# Patient Record
Sex: Male | Born: 1985 | Race: White | Hispanic: No | Marital: Single | Smoking: Never smoker
Health system: Southern US, Community
[De-identification: ages and names within clinical notes are randomized; demographics above are authoritative.]

## PROBLEM LIST (undated history)

## (undated) DIAGNOSIS — K219 Gastro-esophageal reflux disease without esophagitis: Secondary | ICD-10-CM

## (undated) DIAGNOSIS — E7800 Pure hypercholesterolemia, unspecified: Secondary | ICD-10-CM

## (undated) DIAGNOSIS — I10 Essential (primary) hypertension: Secondary | ICD-10-CM

## (undated) DIAGNOSIS — Q8719 Other congenital malformation syndromes predominantly associated with short stature: Secondary | ICD-10-CM

## (undated) HISTORY — PX: NO PAST SURGERIES: SHX2092

## (undated) HISTORY — DX: Other congenital malformation syndromes predominantly associated with short stature: Q87.19

## (undated) HISTORY — DX: Pure hypercholesterolemia, unspecified: E78.00

## (undated) HISTORY — DX: Essential (primary) hypertension: I10

## (undated) HISTORY — DX: Gastro-esophageal reflux disease without esophagitis: K21.9

---

## 2009-08-09 ENCOUNTER — Emergency Department (HOSPITAL_COMMUNITY): Admission: EM | Admit: 2009-08-09 | Discharge: 2009-08-09 | Payer: Self-pay | Admitting: Emergency Medicine

## 2010-10-28 ENCOUNTER — Ambulatory Visit: Payer: Self-pay | Admitting: Internal Medicine

## 2010-10-28 ENCOUNTER — Encounter: Payer: Self-pay | Admitting: Internal Medicine

## 2010-10-28 DIAGNOSIS — L0390 Cellulitis, unspecified: Secondary | ICD-10-CM

## 2010-10-28 DIAGNOSIS — L0291 Cutaneous abscess, unspecified: Secondary | ICD-10-CM

## 2010-11-06 ENCOUNTER — Ambulatory Visit: Payer: Self-pay | Admitting: Internal Medicine

## 2010-12-18 NOTE — Assessment & Plan Note (Signed)
Summary: NEW TO MD AND CLINIC ?INFECTION/CH   Vital Signs:  Patient profile:   25 year old male Height:      72 inches Weight:      201.9 pounds BMI:     27.48 Temp:     97.9 degrees F oral Pulse rate:   71 / minute BP sitting:   115 / 73  (right arm)  Vitals Entered By: Filomena Jungling NT II (October 28, 2010 9:36 AM) CC: 3 years ago on the jaw bump, then 1 week ago this came on his forehead Is Patient Diabetic? No Pain Assessment Patient in pain? no      Nutritional Status BMI of 25 - 29 = overweight  Have you ever been in a relationship where you felt threatened, hurt or afraid?No   Does patient need assistance? Functional Status Self care Ambulation Normal   CC:  3 years ago on the jaw bump and then 1 week ago this came on his forehead.  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   He said he first noticed what started out as a pimple on his forehead about one week ago. Patient's sister thought it was best to "pop" it. She was not able to pop it at that time. Over the week, it has grown in size, started to swell. He also noticed that his right eyelid was swollen and puffy when he woke up this morning. He had a similar pimple/abscess on his right cheek a few years ago. That pimple was cultured and pt's mother was told it was MRSA. He received abx at that time.   Patient denies any fevers or chills. No other abscess or pimples anywhere else.   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   Head:  normocephalic and atraumatic.   Neck:  supple.   Lungs:  normal respiratory effort and normal breath sounds.   Heart:  normal rate and regular rhythm.   Abdomen:  soft and non-tender.   Skin:  3-4 cm abscess on forehead, there is no drainage from the area, there is some scabbing over the abscess, there is erythema surrounding the abscess and some puffiness located over the right eyelid it is not tender to touch, there is no warmth,  no other abscesses, pimples, lesions located anywhere else    Impression & Recommendations:  Problem # 1:  ABSCESS (ICD-682.9) Assessment New  Patient has hx of MRSA abscess on cheek. It is likely the abscess on the forehead is also MRSA. Will perform I&D of the abscess, send it off for culture, and start patient on doxycycline for 10 days.   Procedure - I&D of abscess on forehead Consent was obtained to perform I&D of facial abscess. Timeout was performed. Area was thoroughly cleaned with chloroseptic wipe. Lidocaine was then injected into affected area. Using an 11 blade and incision was made into the abscess. No puss was drained, but blood was expressed from site, and  sent for culture. After drainage of abscess, the area was cleaned with alcohol, and 2x2 gauze was applied. Patient was advised to keep dressing in place for one day. Along with I&D pt was given 10 day course of doxycycline covering S. Aureus. Patient was advised to let abscess drain, and was told to contact clinic if the abscess worsens or gets re-infected.   His updated medication list for this problem includes:    Doxycycline Hyclate 100 Mg Tabs (Doxycycline hyclate) .Marland Kitchen... Take 1 tablet by mouth two times a day for 10 days  Complete Medication List: 1)  Doxycycline Hyclate 100 Mg Tabs (Doxycycline hyclate) .... Take 1 tablet by mouth two times a day for 10 days  Patient Instructions: 1)  Please follow up with Dr. Baltazar Apo next week before Friday December 23rd, 2011. If you notice fever, chills, or any new abscesses, please come in sooner. 2)  Please take Doxycycline which is antibiotic twice daily for 10 days.  Prescriptions: DOXYCYCLINE HYCLATE 100 MG TABS (DOXYCYCLINE HYCLATE) Take 1 tablet by mouth two times a day for 10 days  #20 x 0   Entered and Authorized by:   Melida Quitter MD   Signed by:   Melida Quitter MD on 10/28/2010   Method used:   Print then Give to Patient   RxID:   [id number]    Orders Added: 1)  Est. Patient Level III [53664]      Appended Document: NEW TO MD AND CLINIC ?INFECTION/CH          Impression & Recommendations:  Problem # 1:  ABSCESS (ICD-682.9)  His updated medication list for this problem includes:    Doxycycline Hyclate 100 Mg Tabs (Doxycycline hyclate) .Marland Kitchen... Take 1 tablet by mouth two times a day for 10 days  Complete Medication List: 1)  Doxycycline Hyclate 100 Mg Tabs (Doxycycline hyclate) .... Take 1 tablet by mouth two times a day for 10 days  Other Orders: T-Culture, Wound ([id number])

## 2010-12-18 NOTE — Miscellaneous (Signed)
Summary: PATIENT CONSENT FORM  PATIENT CONSENT FORM   Imported By: Louretta Parma 11/13/2010 16:41:27  _____________________________________________________________________  External Attachment:    Type:   Image     Comment:   External Document

## 2010-12-18 NOTE — Miscellaneous (Signed)
Summary: CONSENT FOR MEDICAL /SURGICAL   CONSENT FOR MEDICAL /SURGICAL   Imported By: Margie Billet 11/05/2010 13:50:42  _____________________________________________________________________  External Attachment:    Type:   Image     Comment:   External Document

## 2010-12-18 NOTE — Assessment & Plan Note (Signed)
Summary: EST-1 WEEK RECHECK/CH   Vital Signs:  Patient profile:   25 year old male Height:      72 inches (182.88 cm) Weight:      210 pounds (95.45 kg) BMI:     28.58 Temp:     97.8 degrees F (36.56 degrees C) oral Pulse rate:   80 / minute BP sitting:   114 / 79  (right arm)  Vitals Entered By: Cynda Familia Duncan Dull) (November 06, 2010 9:21 AM) CC: one week reck, pt states he's much better Is Patient Diabetic? No Pain Assessment Patient in pain? no      Nutritional Status BMI of 25 - 29 = overweight  Have you ever been in a relationship where you felt threatened, hurt or afraid?No   Does patient need assistance? Functional Status Self care Ambulation Normal   CC:  one week reck and pt states he's much better.  History of Present Illness: Mr. 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   It was I&D last week, which did not grow anything, and was started on doxy for empiric tx of MRSA. Patient states he feels much better, and that the swelling on his forehead has decreased considerably. He is not experiencing any pain or tenderness, and denies any purulent discharge or pus from that area.   Patient denies any fevers or chills.   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  VS reviewed and BP wnl Head:  normocephalic and atraumatic.   Neck:  supple.   Lungs:  normal respiratory effort and normal breath sounds.   Heart:  normal rate and regular rhythm.   Abdomen:  soft and non-tender.   Msk:  normal ROM.   Neurologic:  nonfocal  Skin:  abscess on forehead much smaller, scab formation, appears to have healed pimple located above the prior abscess, no discharge or pus slight inflammation located on left cheek, no discharge, pus   Impression & Recommendations:  Problem # 1:  ABSCESS (ICD-682.9) Improved. Cx data negative. Pt has almost completed abx course. There are two other areas of inflammation/pimples located on forehead and left cheek. Advised patient to avoid touching these areas and apply warm compresses if there is any increased swelling.   His updated medication list for this problem includes:    Doxycycline Hyclate 100 Mg Tabs (Doxycycline hyclate) .Marland Kitchen... Take 1 tablet by mouth two times a day for 10 days  Complete Medication List: 1)  Doxycycline Hyclate 100 Mg Tabs (Doxycycline hyclate) .... Take 1 tablet by mouth two times a day for  10 days   Orders Added: 1)  Est. Patient Level III [16109]     Prevention & Chronic Care Immunizations   Influenza vaccine: Not documented    Tetanus booster: Not documented    Pneumococcal vaccine: Not documented  Other Screening   Smoking status: never  (11/06/2010)

## 2011-04-22 ENCOUNTER — Encounter: Payer: Self-pay | Admitting: Internal Medicine

## 2011-07-21 ENCOUNTER — Ambulatory Visit (INDEPENDENT_AMBULATORY_CARE_PROVIDER_SITE_OTHER): Payer: Medicaid Other | Admitting: Internal Medicine

## 2011-07-21 ENCOUNTER — Telehealth: Payer: Self-pay | Admitting: *Deleted

## 2011-07-21 ENCOUNTER — Encounter: Payer: Self-pay | Admitting: Internal Medicine

## 2011-07-21 VITALS — BP 125/81 | HR 63 | Temp 97.6°F | Ht 72.0 in | Wt 196.3 lb

## 2011-07-21 DIAGNOSIS — L03119 Cellulitis of unspecified part of limb: Secondary | ICD-10-CM

## 2011-07-21 DIAGNOSIS — L02429 Furuncle of limb, unspecified: Secondary | ICD-10-CM

## 2011-07-21 DIAGNOSIS — L02419 Cutaneous abscess of limb, unspecified: Secondary | ICD-10-CM

## 2011-07-21 MED ORDER — DOXYCYCLINE HYCLATE 100 MG PO CAPS: 100.0000 mg | ORAL_CAPSULE | Freq: Two times a day (BID) | ORAL | Status: AC

## 2011-07-21 NOTE — Patient Instructions (Addendum)
Please call the clinic to make an appointment if needed. Please take doxycycline 100 mg twice a day for 1 week. If the lesions get worse, have fever/chills, nausea, vomiting,  call the clinic to make an appointment. Take advil 400 mg three times a day to help the pain and inflammation.

## 2011-07-21 NOTE — Telephone Encounter (Signed)
States pt came home this weekend w/ red pustules on L leg, swollen, looks like mrsa he has had before, desires appt, denies fever and drainage. appt given for this pm

## 2011-07-21 NOTE — Assessment & Plan Note (Signed)
As per history of present illness and physical exam, patient had probable bug or mosquito bite and has superficial secondary skin infection-likely MRSA as he had a she MRSA abscess in December 2011. I will treat him with doxycycline 100 mg by mouth twice a day for one week. Explained him and his mother to avoid going in sun for long time during this week and also if the infection gets worse in terms of his fever, chills, nausea vomiting, not feeling well or develops abscess or increased pain, call the clinic and make an appointment. Also advised him to take Advil 400 mg 3 times a day for next week to help inflammation and pain. Otherwise if the lesions are getting better at the end of the week of treatment with doxycycline-noted to call the clinic.

## 2011-07-21 NOTE — Progress Notes (Signed)
  Subjective:    Patient ID: Richard Marks, male    DOB: 12-27-1985, 25 y.o.   MRN: [id number]  HPI Richard Marks is a pleasant 25 year man who comes the clinic with complaint of left lower leg pain and red skin lesions since last Saturday. He is otherwise healthy and has no other issues. He went with his dad to [place], [place] during the weekend and probably got some bug bite there- as per mom who accompanies him today. He does complain of increased pain while putting pressure on his left leg. Denies any fever, chills, nausea, vomiting, chest pain, abdominal pain.    Review of Systems    as per history of present illness, all systems reviewed and negative Objective:   Physical Exam General: NAD HEENT: PERRL, EOMI, no scleral icterus Cardiac: RRR, no rubs, murmurs or gallops Pulm: clear to auscultation bilaterally, moving normal volumes of air Abd: soft, nontender, nondistended, BS present Ext: Multiple red round lesions with scabs on left lower extremity just above the ankle. Mildly warm to touch, nontender, non-draining. 1 similar lesion on left leg above the ankle. Neuro: alert and oriented X3, cranial nerves II-XII grossly intact, strength and sensation to light touch equal in bilateral upper and lower extremities        Assessment & Plan:

## 2011-07-21 NOTE — Telephone Encounter (Signed)
Agree. Thanks Daleen Bo.

## 2012-02-10 ENCOUNTER — Encounter: Payer: Medicaid Other | Admitting: Internal Medicine

## 2012-03-09 ENCOUNTER — Telehealth: Payer: Self-pay

## 2012-03-09 NOTE — Telephone Encounter (Signed)
Pt mom calling states that this is her last shot to try and get help for her son, she is asking if Cleta Alberts can write a letter explaining pt learning disabilities and his syndromes, please contact her at [phone]

## 2012-03-10 NOTE — Telephone Encounter (Signed)
Tried to call patient but phone just kept ringing.

## 2012-03-10 NOTE — Telephone Encounter (Signed)
Call Richard Marks and tell her to make an appointment for Richard Marks to see me at 104. That will allow me to update were Richard Marks is regarding his needs. Then I can write a letter on his behalf to see if we can get him approved for disability.

## 2012-03-11 NOTE — Telephone Encounter (Signed)
Spoke with patients mother and let her know that she should make an appointment with Dr. Cleta Alberts and he would reevaluate Richard Marks at that time.  Mother stated that she would.

## 2012-04-19 ENCOUNTER — Encounter: Payer: Self-pay | Admitting: Emergency Medicine

## 2012-04-19 ENCOUNTER — Ambulatory Visit (INDEPENDENT_AMBULATORY_CARE_PROVIDER_SITE_OTHER): Payer: Self-pay | Admitting: Emergency Medicine

## 2012-04-19 VITALS — BP 112/74 | HR 69 | Temp 97.3°F | Resp 16 | Ht 71.0 in | Wt 195.0 lb

## 2012-04-19 DIAGNOSIS — Q898 Other specified congenital malformations: Secondary | ICD-10-CM

## 2012-04-19 DIAGNOSIS — F8189 Other developmental disorders of scholastic skills: Secondary | ICD-10-CM

## 2012-04-19 DIAGNOSIS — Q8719 Other congenital malformation syndromes predominantly associated with short stature: Secondary | ICD-10-CM

## 2012-04-19 DIAGNOSIS — F79 Unspecified intellectual disabilities: Secondary | ICD-10-CM

## 2012-04-19 DIAGNOSIS — F819 Developmental disorder of scholastic skills, unspecified: Secondary | ICD-10-CM

## 2012-04-19 NOTE — Progress Notes (Signed)
  Subjective:    Patient ID: Richard Marks, male    DOB: 12-08-1985, 26 y.o.   MRN: [id number]  HPI patient enters today to discuss his situation regarding applications for Social Security disability. The patient has Robinows syndrome. The patient required special assistance during his school years. His IQ has been documented as low as well as the patient having significant learning disabilities that required help during school. He's had some physical disabilities with use of the upper extremities. He demonstrated that with movement of one arm also moves unintentionally. 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 The mother feels that since he worked a short period of time under the guidance of his father that this has been used against him regarding his certification for Social Security disability. Short period of time his father had enough income to where he did not qualify for Medicaid but has qualified at least since 2009. He does receive $200 a month for food stamps.they do have the help of a lawyer Mr. Shelle Iron.they have been turned down twice by [employer] and have an appeals pending     Review of Systems     Objective:   Physical ExamPhysical exam reveals microcephaly. 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 His chest is clear cardiac unremarkable neurological is intact without focal motor weakness.         Assessment & Plan:   Richard Marks inability to work is more based on his difficulty with learning more so than his physical disabilities. I  Do not think at the present time he is able to hold down a stable job. I do think he should get another opinion regarding his ability to obtain Social Security disability. I will contact his lawyer to get their  recommendations and opinion is regarding his appeal

## 2013-07-20 ENCOUNTER — Encounter: Payer: Self-pay | Admitting: Internal Medicine

## 2013-07-20 ENCOUNTER — Ambulatory Visit (INDEPENDENT_AMBULATORY_CARE_PROVIDER_SITE_OTHER): Payer: Medicaid Other | Admitting: Internal Medicine

## 2013-07-20 VITALS — BP 117/78 | HR 75 | Temp 97.9°F | Ht 71.0 in | Wt 189.5 lb

## 2013-07-20 DIAGNOSIS — Z23 Encounter for immunization: Secondary | ICD-10-CM

## 2013-07-20 DIAGNOSIS — Q8719 Other congenital malformation syndromes predominantly associated with short stature: Secondary | ICD-10-CM | POA: Insufficient documentation

## 2013-07-20 DIAGNOSIS — Q898 Other specified congenital malformations: Secondary | ICD-10-CM

## 2013-07-20 NOTE — Assessment & Plan Note (Addendum)
Patient doing very well, has appeal for disability with SS pending.  Patient has low IQ but physically few limitations. Only active compliant is occasional left knee pain for which he takes ibuprofen occasionally.  Will give Flu shot today. No indication for screening for lipids at this time.  Will screen at age 27. Will check Random blood glucose as mother has history of DM.-> was less than 100.  Patient does not need to be seen yearly, can follow up as needed and be seen again in 3-5 years.  Patient instructed he can come in for Flu clinic appointment's yearly without seeing provider.

## 2013-07-20 NOTE — Patient Instructions (Signed)
1.  Please make an appointment to come in and see [place] as you need. 2.  Call and make an appointment in 3 years for regular visit.

## 2013-07-20 NOTE — Progress Notes (Signed)
Patient ID: Richard Marks, male   DOB: [birth year], 27 y.o.   MRN: [id number]   Subjective:   Patient ID: Richard Marks male   DOB: [birth year] 27 y.o.   MRN: [id number]  HPI: [name] is a 27 y.o. male who presents to reestablish at clinic.  He has a past medical history [name] for which she was diagnosed at 27 years old.  He is also been diagnosed with a low IQ. He is currently appealing [employer] for disability. 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   he has no other past medical history. Takes no prescription or regular over-the-counter medications.   he does report that he saw an eye doctor who recommended glasses for reading. He does not smoke.    Past Medical History  Diagnosis Date  . [name]     Diagnosed at [facility] when patient was 27 years old   No current outpatient prescriptions on file.   No current facility-administered medications for this visit.   Family History  Problem Relation Age of Onset  . Diabetes Mother   . Hypertension Mother   . Hyperlipidemia Mother   . Hypertension Father   . Diabetes Sister   . Hypertension Sister   . Obesity Sister    History   Social History  . Marital Status: Single    Spouse Name: N/A    Number of Children: N/A  . Years of Education: N/A   Social History Main Topics  . Smoking status: Never Smoker   . Smokeless tobacco: None  . Alcohol Use: None  . Drug Use: None  . Sexual Activity: None   Other Topics Concern  . 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  HENT: Negative for ear pain, nosebleeds, neck pain and tinnitus.   Eyes: Negative for blurred vision and  double vision.  Respiratory: Negative for cough, hemoptysis, sputum production, shortness of breath and wheezing.   Cardiovascular: Negative for chest pain, palpitations and leg swelling.  Gastrointestinal: Negative for heartburn, nausea, vomiting, abdominal pain, diarrhea, constipation and melena.  Genitourinary: Negative for dysuria, urgency and frequency.  Musculoskeletal: Positive for joint pain (occasional left knee pain, chronic). Negative for myalgias.  Skin: Negative for rash.  Neurological: Negative for dizziness, speech change, focal weakness, weakness and headaches.  Psychiatric/Behavioral: Negative for depression.    Objective:  Physical Exam: Filed Vitals:   07/20/13 1322  BP: 117/78  Pulse: 75  Temp: 97.9 F (36.6 C)  TempSrc: Oral  Height: 5\' 11"  (1.803 m)  Weight: 189 lb 8 oz (85.957 kg)  SpO2: 99%   Physical Exam  Nursing note and vitals reviewed. Constitutional: He is oriented to person, place, and time. No distress.  HENT:  Head: Normocephalic and atraumatic.  Eyes: Conjunctivae and EOM are normal. Pupils are equal, round, and reactive to light.  Cardiovascular: Normal rate, regular rhythm, normal heart sounds and intact distal pulses.  Exam reveals no friction rub.   No murmur heard. Pulmonary/Chest: Effort normal and breath sounds normal. No respiratory distress. He has no wheezes. He has no rales.  Abdominal: Soft. Bowel sounds are  normal. There is no tenderness.  Musculoskeletal: Normal range of motion. He exhibits no edema.  Neurological: He is alert and oriented to person, place, and time. Gait normal.  Skin: He is not diaphoretic.  Psychiatric: Affect and judgment normal.    Assessment & Plan:   See Problem based Assessment and Plan.

## 2013-07-26 NOTE — Progress Notes (Signed)
I saw and evaluated the patient.  I personally confirmed the key portions of the history and exam documented by Dr. Hoffman and I reviewed pertinent patient test results.  The assessment, diagnosis, and plan were formulated together and I agree with the documentation in the resident's note. 

## 2016-05-11 ENCOUNTER — Other Ambulatory Visit: Payer: Self-pay

## 2016-05-11 ENCOUNTER — Emergency Department (HOSPITAL_COMMUNITY): Payer: Self-pay

## 2016-05-11 ENCOUNTER — Emergency Department (HOSPITAL_COMMUNITY)
Admission: EM | Admit: 2016-05-11 | Discharge: 2016-05-11 | Disposition: A | Discharge status: Home / Self Care | Payer: Self-pay | Attending: Emergency Medicine | Admitting: Emergency Medicine

## 2016-05-11 ENCOUNTER — Encounter (HOSPITAL_COMMUNITY): Payer: Self-pay | Admitting: Emergency Medicine

## 2016-05-11 DIAGNOSIS — K5900 Constipation, unspecified: Secondary | ICD-10-CM | POA: Insufficient documentation

## 2016-05-11 DIAGNOSIS — R109 Unspecified abdominal pain: Secondary | ICD-10-CM

## 2016-05-11 LAB — CBC
HCT: 41.7 % (ref 39.0–52.0)
Hemoglobin: 14.5 g/dL (ref 13.0–17.0)
MCH: 33.1 pg (ref 26.0–34.0)
MCHC: 34.8 g/dL (ref 30.0–36.0)
MCV: 95.2 fL (ref 78.0–100.0)
PLATELETS: 207 10*3/uL (ref 150–400)
RBC: 4.38 MIL/uL (ref 4.22–5.81)
RDW: 12.8 % (ref 11.5–15.5)
WBC: 5.8 10*3/uL (ref 4.0–10.5)

## 2016-05-11 LAB — URINALYSIS, ROUTINE W REFLEX MICROSCOPIC
BILIRUBIN URINE: NEGATIVE
GLUCOSE, UA: NEGATIVE mg/dL
HGB URINE DIPSTICK: NEGATIVE
KETONES UR: NEGATIVE mg/dL
Leukocytes, UA: NEGATIVE
Nitrite: NEGATIVE
PH: 7 (ref 5.0–8.0)
Protein, ur: NEGATIVE mg/dL
Specific Gravity, Urine: 1.013 (ref 1.005–1.030)

## 2016-05-11 LAB — COMPREHENSIVE METABOLIC PANEL
ALK PHOS: 49 U/L (ref 38–126)
ALT: 18 U/L (ref 17–63)
ANION GAP: 7 (ref 5–15)
AST: 19 U/L (ref 15–41)
Albumin: 4.3 g/dL (ref 3.5–5.0)
BUN: 19 mg/dL (ref 6–20)
CALCIUM: 8.9 mg/dL (ref 8.9–10.3)
CHLORIDE: 107 mmol/L (ref 101–111)
CO2: 26 mmol/L (ref 22–32)
Creatinine, Ser: 0.87 mg/dL (ref 0.61–1.24)
GFR calc non Af Amer: >60 mL/min (ref >60)
Glucose, Bld: 105 mg/dL — ABNORMAL HIGH (ref 65–99)
POTASSIUM: 3.9 mmol/L (ref 3.5–5.1)
SODIUM: 140 mmol/L (ref 135–145)
Total Bilirubin: 0.5 mg/dL (ref 0.3–1.2)
Total Protein: 7.4 g/dL (ref 6.5–8.1)

## 2016-05-11 MED ORDER — KETOROLAC TROMETHAMINE 30 MG/ML IJ SOLN
30.0000 mg | Freq: Once | INTRAMUSCULAR | Status: AC
Start: 2016-05-11 — End: 2016-05-11
  Administered 2016-05-11: 30 mg via INTRAVENOUS
  Filled 2016-05-11: qty 1

## 2016-05-11 MED ORDER — ONDANSETRON HCL 4 MG/2ML IJ SOLN
4.0000 mg | Freq: Once | INTRAMUSCULAR | Status: AC | PRN
  Administered 2016-05-11: 4 mg via INTRAVENOUS
  Filled 2016-05-11: qty 2

## 2016-05-11 NOTE — ED Notes (Signed)
Patient started having this pain Wednesday. Patient complaining the abdominal pain was worse tonight. Patient has learning disabilities and mom is power of attorney. Patients abdomen hurts at the lower half of the abdomen.

## 2016-05-11 NOTE — ED Provider Notes (Signed)
CSN: [id number]     Arrival date & time 05/11/16  0407 History   First MD Initiated Contact with Patient 05/11/16 [phone]     Chief Complaint  Patient presents with  . Abdominal Pain     (Consider location/radiation/quality/duration/timing/severity/associated sxs/prior Treatment) Patient is a 30 y.o. male presenting with abdominal pain. The history is provided by the patient. No language interpreter was used.  Abdominal Pain Pain location:  Generalized Pain quality: burning   Pain radiates to:  Does not radiate Pain severity:  Severe Onset quality:  Gradual Duration:  12 days Progression:  Worsening Chronicity:  New Context: not alcohol use and not retching   Relieved by:  Nothing Worsened by:  Nothing tried Ineffective treatments:  None tried Associated symptoms: no anorexia, no diarrhea and no vomiting   Risk factors: no alcohol abuse     Past Medical History  Diagnosis Date  . 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 No pertinent past surgical history. Family History  Problem Relation Age of Onset  . Diabetes Mother   . Hypertension Mother   . Hyperlipidemia Mother   . Hypertension Father   . Diabetes Sister   . Hypertension Sister   . Obesity Sister    Social History  Substance Use Topics  . Smoking status: Never Smoker   . Smokeless tobacco: Never Used  . Alcohol Use: No    Review of Systems  Gastrointestinal: Positive for abdominal pain. Negative for vomiting, diarrhea and anorexia.  All other systems reviewed and are negative.     Allergies  Review of patient's allergies indicates no known allergies.  Home Medications   Prior to Admission medications   Not on File   BP 137/87 mmHg  Pulse 71  Temp(Src) 99 F (37.2 C) (Oral)  Resp 16  Ht  (1.803 m)  Wt 200 lb (90.719 kg)  BMI 27.91 kg/m2  SpO2 99% Physical Exam  Constitutional: He is oriented to person, place, and time. He  appears well-developed and well-nourished. No distress.  HENT:  Head: Normocephalic and atraumatic.  Mouth/Throat: Oropharynx is clear and moist.  Eyes: Conjunctivae are normal. Pupils are equal, round, and reactive to light.  Neck: Normal range of motion. Neck supple.  Cardiovascular: Normal rate, regular rhythm and intact distal pulses.   Pulmonary/Chest: Effort normal and breath sounds normal. No respiratory distress. He has no wheezes. He has no rales.  Abdominal: Soft. Bowel sounds are normal. There is no tenderness. There is no rebound and no guarding.  Palpable stool in the descending colon  Musculoskeletal: Normal range of motion.  Neurological: He is alert and oriented to person, place, and time.  Skin: Skin is warm and dry.  Psychiatric: He has a normal mood and affect.    ED Course  Procedures (including critical care time) Labs Review Labs Reviewed  COMPREHENSIVE METABOLIC PANEL - Abnormal; Notable for the following:    Glucose, Bld 105 (*)    All other components within normal limits  URINALYSIS, ROUTINE W REFLEX MICROSCOPIC (NOT AT [facility])  CBC    Imaging Review Dg Abd Acute W/chest  05/11/2016  CLINICAL DATA:  Abdominal pain with nausea and vomiting EXAM: DG ABDOMEN ACUTE W/ 1V CHEST COMPARISON:  None. FINDINGS: PA chest: Lungs are clear. Heart size and pulmonary vascularity are normal. No adenopathy. Supine and upright abdomen: There is diffuse stool throughout the colon. There is no bowel dilatation  or air-fluid level suggesting obstruction. No free air. No abnormal calcifications are identified. IMPRESSION: Stool throughout colon. No bowel obstruction or free air evident. No lung edema or consolidation. Electronically Signed   By: Bretta BangWilliam  Woodruff III M.D.   On: 05/11/2016 07:14   I have personally reviewed and evaluated these images and lab results as part of my medical decision-making.   EKG Interpretation None      MDM   Final diagnoses:  None    Filed  Vitals:   05/11/16 0700 05/11/16 0742  BP: 137/87 129/90  Pulse: 71 75  Temp:    Resp: 16 17   Results for orders placed or performed during the hospital encounter of 05/11/16  Urinalysis, Routine w reflex microscopic- may I&O cath if menses  Result Value Ref Range   Color, Urine YELLOW YELLOW   APPearance CLEAR CLEAR   Specific Gravity, Urine 1.013 1.005 - 1.030   pH 7.0 5.0 - 8.0   Glucose, UA NEGATIVE NEGATIVE mg/dL   Hgb urine dipstick NEGATIVE NEGATIVE   Bilirubin Urine NEGATIVE NEGATIVE   Ketones, ur NEGATIVE NEGATIVE mg/dL   Protein, ur NEGATIVE NEGATIVE mg/dL   Nitrite NEGATIVE NEGATIVE   Leukocytes, UA NEGATIVE NEGATIVE  CBC  Result Value Ref Range   WBC 5.8 4.0 - 10.5 K/uL   RBC 4.38 4.22 - 5.81 MIL/uL   Hemoglobin 14.5 13.0 - 17.0 g/dL   HCT 16.141.7 09.639.0 - 04.552.0 %   MCV 95.2 78.0 - 100.0 fL   MCH 33.1 26.0 - 34.0 pg   MCHC 34.8 30.0 - 36.0 g/dL   RDW 40.912.8 81.111.5 - 91.415.5 %   Platelets 207 150 - 400 K/uL  Comprehensive metabolic panel  Result Value Ref Range   Sodium 140 135 - 145 mmol/L   Potassium 3.9 3.5 - 5.1 mmol/L   Chloride 107 101 - 111 mmol/L   CO2 26 22 - 32 mmol/L   Glucose, Bld 105 (H) 65 - 99 mg/dL   BUN 19 6 - 20 mg/dL   Creatinine, Ser 7.820.87 0.61 - 1.24 mg/dL   Calcium 8.9 8.9 - 95.610.3 mg/dL   Total Protein 7.4 6.5 - 8.1 g/dL   Albumin 4.3 3.5 - 5.0 g/dL   AST 19 15 - 41 U/L   ALT 18 17 - 63 U/L   Alkaline Phosphatase 49 38 - 126 U/L   Total Bilirubin 0.5 0.3 - 1.2 mg/dL   GFR calc non Af Amer >60 >60 mL/min   GFR calc Af Amer >60 >60 mL/min   Anion gap 7 5 - 15   Dg Abd Acute W/chest  05/11/2016  CLINICAL DATA:  Abdominal pain with nausea and vomiting EXAM: DG ABDOMEN ACUTE W/ 1V CHEST COMPARISON:  None. FINDINGS: PA chest: Lungs are clear. Heart size and pulmonary vascularity are normal. No adenopathy. Supine and upright abdomen: There is diffuse stool throughout the colon. There is no bowel dilatation or air-fluid level suggesting obstruction. No  free air. No abnormal calcifications are identified. IMPRESSION: Stool throughout colon. No bowel obstruction or free air evident. No lung edema or consolidation. Electronically Signed   By: Bretta BangWilliam  Woodruff III M.D.   On: 05/11/2016 07:14    Medications  ondansetron (ZOFRAN) injection 4 mg (4 mg Intravenous Given 05/11/16 0445)  ketorolac (TORADOL) 30 MG/ML injection 30 mg (30 mg Intravenous Given 05/11/16 0545)    Pain free post medication.   Exam and Xrays consistent with constipation.  Start liquid diet x 3 days and  miralax BID x 7 days then advance to a high fiber diet then miralax daily.  Strict return precautions      Amiracle Neises, MD 05/11/16 0830

## 2017-10-20 ENCOUNTER — Encounter: Payer: Self-pay | Admitting: Physician Assistant

## 2017-10-20 ENCOUNTER — Other Ambulatory Visit: Payer: Self-pay

## 2017-10-20 ENCOUNTER — Ambulatory Visit (INDEPENDENT_AMBULATORY_CARE_PROVIDER_SITE_OTHER): Payer: Medicare Other | Admitting: Physician Assistant

## 2017-10-20 VITALS — BP 130/86 | HR 73 | Temp 98.2°F | Resp 14 | Ht 72.5 in | Wt 217.0 lb

## 2017-10-20 DIAGNOSIS — Z23 Encounter for immunization: Secondary | ICD-10-CM

## 2017-10-20 DIAGNOSIS — Z0000 Encounter for general adult medical examination without abnormal findings: Secondary | ICD-10-CM | POA: Insufficient documentation

## 2017-10-20 DIAGNOSIS — Q871 Congenital malformation syndromes predominantly associated with short stature: Secondary | ICD-10-CM

## 2017-10-20 DIAGNOSIS — Z7689 Persons encountering health services in other specified circumstances: Secondary | ICD-10-CM | POA: Insufficient documentation

## 2017-10-20 DIAGNOSIS — Z299 Encounter for prophylactic measures, unspecified: Secondary | ICD-10-CM | POA: Diagnosis not present

## 2017-10-20 DIAGNOSIS — Q8719 Other congenital malformation syndromes predominantly associated with short stature: Secondary | ICD-10-CM

## 2017-10-20 LAB — CBC WITH DIFFERENTIAL/PLATELET
BASOS PCT: 0.5 % (ref 0.0–3.0)
Basophils Absolute: 0 10*3/uL (ref 0.0–0.1)
EOS PCT: 1.4 % (ref 0.0–5.0)
Eosinophils Absolute: 0.1 10*3/uL (ref 0.0–0.7)
HEMATOCRIT: 46.7 % (ref 39.0–52.0)
HEMOGLOBIN: 15.9 g/dL (ref 13.0–17.0)
LYMPHS PCT: 27.6 % (ref 12.0–46.0)
Lymphs Abs: 1.7 10*3/uL (ref 0.7–4.0)
MCHC: 34 g/dL (ref 30.0–36.0)
MCV: 97.9 fl (ref 78.0–100.0)
MONOS PCT: 5.1 % (ref 3.0–12.0)
Monocytes Absolute: 0.3 10*3/uL (ref 0.1–1.0)
Neutro Abs: 4.1 10*3/uL (ref 1.4–7.7)
Neutrophils Relative %: 65.4 % (ref 43.0–77.0)
Platelets: 222 10*3/uL (ref 150.0–400.0)
RBC: 4.77 Mil/uL (ref 4.22–5.81)
RDW: 13 % (ref 11.5–15.5)
WBC: 6.2 10*3/uL (ref 4.0–10.5)

## 2017-10-20 LAB — COMPREHENSIVE METABOLIC PANEL
ALBUMIN: 4.8 g/dL (ref 3.5–5.2)
ALK PHOS: 52 U/L (ref 39–117)
ALT: 28 U/L (ref 0–53)
AST: 22 U/L (ref 0–37)
BUN: 13 mg/dL (ref 6–23)
CALCIUM: 9.1 mg/dL (ref 8.4–10.5)
CHLORIDE: 105 meq/L (ref 96–112)
CO2: 27 mEq/L (ref 19–32)
CREATININE: 0.86 mg/dL (ref 0.40–1.50)
GFR: 110.02 mL/min (ref >60.00)
Glucose, Bld: 112 mg/dL — ABNORMAL HIGH (ref 70–99)
POTASSIUM: 4 meq/L (ref 3.5–5.1)
SODIUM: 140 meq/L (ref 135–145)
TOTAL PROTEIN: 7.1 g/dL (ref 6.0–8.3)
Total Bilirubin: 0.3 mg/dL (ref 0.2–1.2)

## 2017-10-20 LAB — TSH: TSH: 1.39 u[IU]/mL (ref 0.35–4.50)

## 2017-10-20 LAB — LIPID PANEL
CHOL/HDL RATIO: 6
CHOLESTEROL: 173 mg/dL (ref 0–200)
HDL: 30.7 mg/dL — ABNORMAL LOW (ref >39.00)
NonHDL: 142.44
TRIGLYCERIDES: 213 mg/dL — AB (ref 0.0–149.0)
VLDL: 42.6 mg/dL — AB (ref 0.0–40.0)

## 2017-10-20 LAB — LDL CHOLESTEROL, DIRECT: LDL DIRECT: 118 mg/dL

## 2017-10-20 NOTE — Patient Instructions (Signed)
Please go to the lab for blood work.   Our office will call you with your results unless you have chosen to receive results via MyChart.  If your blood work is normal we will follow-up each year for physicals and as scheduled for chronic medical problems.  If anything is abnormal we will treat accordingly and get you in for a follow-up.  Please wear the knee sleeve when working or having to stand for prolonged periods. The compression should help with pain. Continue Ibuprofen or an Aleve as needed. Apply Topical Icy Hot to the area. If not improving, we will need to get an x-ray.   Preventive Care 18-39 Years, Male Preventive care refers to lifestyle choices and visits with your health care provider that can promote health and wellness. What does preventive care include?  A yearly physical exam. This is also called an annual well check.  Dental exams once or twice a year.  Routine eye exams. Ask your health care provider how often you should have your eyes checked.  Personal lifestyle choices, including: ? Daily care of your teeth and gums. ? Regular physical activity. ? Eating a healthy diet. ? Avoiding tobacco and drug use. ? Limiting alcohol use. ? Practicing safe sex. What happens during an annual well check? The services and screenings done by your health care provider during your annual well check will depend on your age, overall health, lifestyle risk factors, and family history of disease. Counseling Your health care provider may ask you questions about your:  Alcohol use.  Tobacco use.  Drug use.  Emotional well-being.  Home and relationship well-being.  Sexual activity.  Eating habits.  Work and work Statistician.  Screening You may have the following tests or measurements:  Height, weight, and BMI.  Blood pressure.  Lipid and cholesterol levels. These may be checked every 5 years starting at age 83.  Diabetes screening. This is done by checking  your blood sugar (glucose) after you have not eaten for a while (fasting).  Skin check.  Hepatitis C blood test.  Hepatitis B blood test.  Sexually transmitted disease (STD) testing.  Discuss your test results, treatment options, and if necessary, the need for more tests with your health care provider. Vaccines Your health care provider may recommend certain vaccines, such as:  Influenza vaccine. This is recommended every year.  Tetanus, diphtheria, and acellular pertussis (Tdap, Td) vaccine. You may need a Td booster every 10 years.  Varicella vaccine. You may need this if you have not been vaccinated.  HPV vaccine. If you are 73 or younger, you may need three doses over 6 months.  Measles, mumps, and rubella (MMR) vaccine. You may need at least one dose of MMR.You may also need a second dose.  Pneumococcal 13-valent conjugate (PCV13) vaccine. You may need this if you have certain conditions and have not been vaccinated.  Pneumococcal polysaccharide (PPSV23) vaccine. You may need one or two doses if you smoke cigarettes or if you have certain conditions.  Meningococcal vaccine. One dose is recommended if you are age 47-21 years and a first-year college student living in a residence hall, or if you have one of several medical conditions. You may also need additional booster doses.  Hepatitis A vaccine. You may need this if you have certain conditions or if you travel or work in places where you may be exposed to hepatitis A.  Hepatitis B vaccine. You may need this if you have certain conditions or if you  travel or work in places where you may be exposed to hepatitis B.  Haemophilus influenzae type b (Hib) vaccine. You may need this if you have certain risk factors.  Talk to your health care provider about which screenings and vaccines you need and how often you need them. This information is not intended to replace advice given to you by your health care provider. Make sure you  discuss any questions you have with your health care provider. Document Released: 12/29/2001 Document Revised: 07/22/2016 Document Reviewed: 09/03/2015 Elsevier Interactive Patient Education  2017 [employer]. .

## 2017-10-20 NOTE — Progress Notes (Addendum)
Patient with presents to clinic today with his mother to establish care.Patient with history of [name], mild. Lives at home with parents who are a good support system. Works Engineer, watermoving storage containers. They are requesting a routine check up today.  Exercise -- Very active with work.  Diet -- well-balanced overall. Does like sweets but family is monitoring. Water intake. No soft drinks.  Health Maintenance: Immunizations -- Requesting flu shot. Tetanus not covered due to Medicare.  Past Medical History:  Diagnosis Date  . [name]    [facility] patient was 31 years old    Past Surgical History:  Procedure Laterality Date  . NO PAST SURGERIES      No current outpatient medications on file prior to visit.   No current facility-administered medications on file prior to visit.     No Known Allergies  Family History  Problem Relation Age of Onset  . Diabetes Mother   . Hypertension Mother   . Hyperlipidemia Mother   . Depression Mother   . Hypertension Father   . Diabetes Father   . COPD Father   . Alcohol abuse Sister   . Asthma Sister   . Depression Sister   . Drug abuse Sister   . Hypertension Sister   . Hypertension Sister   . Depression Sister   . Diabetes Sister   . Depression Brother   . Diabetes Maternal Grandmother   . Heart disease Maternal Grandmother   . Hyperlipidemia Maternal Grandmother   . Kidney disease Maternal Grandmother   . Heart disease Maternal Grandfather   . Hypertension Maternal Grandfather   . Kidney disease Maternal Grandfather   . Arthritis Paternal Grandmother   . Diabetes Paternal Grandmother   . Heart disease Paternal Grandmother   . Hyperlipidemia Paternal Grandmother   . Hypertension Paternal Grandmother   . Kidney disease Paternal Grandmother   . Stroke Paternal Grandmother   . Asthma Paternal Grandfather   . COPD Paternal Grandfather   . Hyperlipidemia Paternal Grandfather   .  Hypertension Paternal Grandfather   . Kidney disease Paternal Grandfather     Social History   Socioeconomic History  . Marital status: Single    Spouse name: Not on file  . Number of children: Not on file  . Years of education: Not on file  . Highest education level: Not on file  Social Needs  . Financial resource strain: Not on file  . Food insecurity - worry: Not on file  . Food insecurity - inability: Not on file  . Transportation needs - medical: Not on file  . Transportation needs - non-medical: Not on file  Occupational History  . Not on file  Tobacco Use  . Smoking status: Never Smoker  . Smokeless tobacco: Never Used  Substance and Sexual Activity  . Alcohol use: No  . Drug use: No  . Sexual activity: No  Other Topics Concern  . 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  HENT: Negative for ear discharge, ear pain, hearing loss and tinnitus.   Eyes: Negative for blurred vision, double vision, photophobia and pain.  Respiratory: Negative for cough and shortness of breath.   Cardiovascular: Negative for  chest pain and palpitations.  Gastrointestinal: Negative for abdominal pain, blood in stool, constipation, diarrhea, heartburn, melena, nausea and vomiting.  Genitourinary: Negative for dysuria, flank pain, frequency, hematuria and urgency.  Musculoskeletal: Negative for falls.  Neurological: Negative for dizziness, loss of consciousness and headaches.  Endo/Heme/Allergies: Negative for environmental allergies.  Psychiatric/Behavioral: Negative for depression, hallucinations, substance abuse and suicidal ideas. The patient is not nervous/anxious and does not have insomnia.    BP 130/86   Pulse 73   Temp 98.2 F (36.8 C) (Oral)   Resp 14    Ht 6' 0.5" (1.842 m)   Wt 217 lb (98.4 kg)   SpO2 98%   BMI 29.03 kg/m   Physical Exam  Constitutional: He is oriented to person, place, and time and well-developed, well-nourished, and in no distress.  HENT:  Head: Normocephalic and atraumatic.  Right Ear: External ear normal.  Left Ear: External ear normal.  Nose: Nose normal.  Mouth/Throat: Oropharynx is clear and moist. No oropharyngeal exudate.  Eyes: Conjunctivae and EOM are normal. Pupils are equal, round, and reactive to light.  Neck: Neck supple. No thyromegaly present.  Cardiovascular: Normal rate, regular rhythm, normal heart sounds and intact distal pulses.  Pulmonary/Chest: Effort normal and breath sounds normal. No respiratory distress. He has no wheezes. He has no rales. He exhibits no tenderness.  Abdominal: Soft. Bowel sounds are normal. He exhibits no distension and no mass. There is no tenderness. There is no rebound and no guarding.  Genitourinary: Testes/scrotum normal.  Lymphadenopathy:    He has no cervical adenopathy.  Neurological: He is alert and oriented to person, place, and time.  Skin: Skin is warm and dry. No rash noted.  Psychiatric: Affect normal.  Vitals reviewed.  Assessment/Plan: [name] Patient doing very well. Physically without disability. Low IQ but verbalizes well.  Encounter to establish care Depression screen negative. Health Maintenance reviewed -- Flu shot updated today. Preventive schedule discussed and handout given in AVS. Will obtain fasting labs today.     Piedad ClimesWilliam Cody Twain Stenseth, PA-C

## 2017-10-20 NOTE — Assessment & Plan Note (Signed)
Patient doing very well. Physically without disability. Low IQ but verbalizes well.

## 2017-10-20 NOTE — Assessment & Plan Note (Signed)
Depression screen negative. Health Maintenance reviewed -- Flu shot updated today. Preventive schedule discussed and handout given in AVS. Will obtain fasting labs today.  

## 2017-10-21 ENCOUNTER — Other Ambulatory Visit (INDEPENDENT_AMBULATORY_CARE_PROVIDER_SITE_OTHER): Payer: Medicare Other

## 2017-10-21 DIAGNOSIS — R7309 Other abnormal glucose: Secondary | ICD-10-CM

## 2017-10-21 LAB — HEMOGLOBIN A1C: Hgb A1c MFr Bld: 5.2 % (ref 4.6–6.5)

## 2017-10-26 ENCOUNTER — Encounter: Payer: Self-pay | Admitting: *Deleted

## 2017-10-26 NOTE — Telephone Encounter (Signed)
This encounter was created in error - please disregard.

## 2017-11-05 NOTE — Addendum Note (Signed)
Addended by: Waldon MerlMARTIN, Quintus Premo C on: 11/05/2017 07:55 AM   Modules accepted: Level of Service

## 2019-03-02 ENCOUNTER — Ambulatory Visit: Payer: PPO | Admitting: Physician Assistant

## 2019-09-12 ENCOUNTER — Telehealth: Payer: Self-pay | Admitting: Emergency Medicine

## 2019-09-12 NOTE — Telephone Encounter (Signed)
Advised patient mother of a video visit first then if need to then we can do an in office.  She states she will stay in the care during his appointment. If he needs any help with questions then we can call her for further information.

## 2019-09-12 NOTE — Telephone Encounter (Signed)
Patient mother is Medical laboratory scientific officer for patient. He has learning disabilities. She usually comes with patient to his appointment to get better understanding for patient.  She wanted to make sure if she can come in. She is ok either way. She state it is very difficult to get information from patient

## 2019-09-12 NOTE — Telephone Encounter (Signed)
Let's start with a video visit since she is still having URI symptoms herself.  Then if I need to bring him in we will find a way to make it work.

## 2019-09-14 ENCOUNTER — Ambulatory Visit (INDEPENDENT_AMBULATORY_CARE_PROVIDER_SITE_OTHER): Payer: PPO

## 2019-09-14 ENCOUNTER — Other Ambulatory Visit: Payer: PPO

## 2019-09-14 ENCOUNTER — Ambulatory Visit (INDEPENDENT_AMBULATORY_CARE_PROVIDER_SITE_OTHER): Payer: PPO | Admitting: Physician Assistant

## 2019-09-14 ENCOUNTER — Other Ambulatory Visit: Payer: Self-pay

## 2019-09-14 ENCOUNTER — Encounter: Payer: Self-pay | Admitting: Physician Assistant

## 2019-09-14 VITALS — BP 128/80 | HR 67 | Temp 98.7°F | Resp 16 | Ht 72.0 in | Wt 222.0 lb

## 2019-09-14 DIAGNOSIS — M545 Low back pain, unspecified: Secondary | ICD-10-CM

## 2019-09-14 DIAGNOSIS — G8929 Other chronic pain: Secondary | ICD-10-CM

## 2019-09-14 DIAGNOSIS — M25552 Pain in left hip: Secondary | ICD-10-CM | POA: Diagnosis not present

## 2019-09-14 MED ORDER — MELOXICAM 15 MG PO TABS: 15.0000 mg | ORAL_TABLET | Freq: Every day | ORAL | 0 refills | Status: DC

## 2019-09-14 NOTE — Progress Notes (Signed)
Patient presents to clinic today c/o left-sided low back and hip pain x 1 year that has become more significant and consistent over the past 2-3 months. Denies any trauma or injury although he does a lot of heavy lifting and pushing with work. Pain is always left-sided and radiates into hip, sometimes into proximal posterior thigh. Denies radiation past this point. Denies numbness, tingling or weakness overall but does note sometimes feeling like his leg wants to give out when walking. Pain is worse with prolonged ambulation and alleviated to some degree with resting. Has taken Tylenol and Ibuprofen without much relief in symptoms. Patient has history of [name].    Past Medical History:  Diagnosis Date  . [name]    Diagnosed at [facility] when patient was 33 years old    No current outpatient medications on file prior to visit.   No current facility-administered medications on file prior to visit.     No Known Allergies  Family History  Problem Relation Age of Onset  . Diabetes Mother   . Hypertension Mother   . Hyperlipidemia Mother   . Depression Mother   . Hypertension Father   . Diabetes Father   . COPD Father   . Alcohol abuse Sister   . Asthma Sister   . Depression Sister   . Drug abuse Sister   . Hypertension Sister   . Hypertension Sister   . Depression Sister   . Diabetes Sister   . Depression Brother   . Diabetes Maternal Grandmother   . Heart disease Maternal Grandmother   . Hyperlipidemia Maternal Grandmother   . Kidney disease Maternal Grandmother   . Heart disease Maternal Grandfather   . Hypertension Maternal Grandfather   . Kidney disease Maternal Grandfather   . Arthritis Paternal Grandmother   . Diabetes Paternal Grandmother   . Heart disease Paternal Grandmother   . Hyperlipidemia Paternal Grandmother   . Hypertension Paternal Grandmother   . Kidney disease Paternal Grandmother   . Stroke Paternal Grandmother   . Asthma  Paternal Grandfather   . COPD Paternal Grandfather   . Hyperlipidemia Paternal Grandfather   . Hypertension Paternal Grandfather   . Kidney disease Paternal Grandfather     Social History   Socioeconomic History  . Marital status: Single    Spouse name: Not on file  . Number of children: Not on file  . Years of education: Not on file  . Highest education level: Not on file  Occupational History  . Not on file  Social Needs  . Financial resource strain: Not on file  . Food insecurity    Worry: Not on file    Inability: Not on file  . Transportation needs    Medical: Not on file    Non-medical: Not on file  Tobacco Use  . Smoking status: Never Smoker  . Smokeless tobacco: Never Used  Substance and Sexual Activity  . Alcohol use: No  . Drug use: No  . Sexual activity: Never  Lifestyle  . Physical activity    Days per week: Not on file    Minutes per session: Not on file  . Stress: Not on file  Relationships  . Social Herbalist on phone: Not on file    Gets together: Not on file    Attends religious service: Not on file    Active member of club or organization: Not on file    Attends meetings of clubs or  organizations: Not on file    Relationship status: Not on file  Other Topics Concern  . 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  All other ROS are negative.  Resp 16   Ht 6' (1.829 m)   Wt 222 lb (100.7 kg)   BMI 30.11 kg/m   Physical Exam Vitals signs reviewed.  Constitutional:      Appearance: Normal appearance.  HENT:     Head: Normocephalic and atraumatic.  Neck:     Musculoskeletal: Neck supple.  Cardiovascular:     Rate and Rhythm: Normal rate and regular rhythm.     Pulses: Normal pulses.     Heart sounds: Normal heart sounds.  Pulmonary:      Effort: Pulmonary effort is normal.  Musculoskeletal:     Right hip: He exhibits normal range of motion, normal strength, no bony tenderness, no swelling and no crepitus.     Left hip: Normal. He exhibits normal range of motion (pain with adduction, IR and ER noted on examination. ), normal strength, no tenderness and no bony tenderness.     Cervical back: Normal.     Thoracic back: Normal.     Lumbar back: He exhibits pain. He exhibits normal range of motion, no tenderness, no bony tenderness and no spasm.  Neurological:     Mental Status: He is alert.    Assessment/Plan: 1. Chronic left-sided low back pain without sciatica 2. Chronic left hip pain Ongoing, worsening. Patient with [name] which can be associated with collagen defects, cartilage and osteoporosis. Will start Meloxicam for now to help with pain. Supportive measures reviewed. Will check x-ray lumbar spine and L hip today to further assess. Will refer to Ortho if needed.  - DG Lumbar Spine Complete; Future - DG HIP UNILAT WITH PELVIS 2-3 VIEWS LEFT; Future      Piedad Climes, PA-C

## 2019-09-14 NOTE — Patient Instructions (Signed)
Please speak with Richard Marks to get your x-ray appt scheduled.   Please go to the [facility] office for x-ray. We will call you with your results and alter treatment accordingly.   [address]  [place], [place] [id number]  Take the Meloxicam once daily with food. Tylenol for breakthrough pain. Recommend heating pad to lower back for 10-15 minutes 2-3 x day.   We will alter treatment based on findings.

## 2019-09-15 ENCOUNTER — Other Ambulatory Visit: Payer: Self-pay | Admitting: Physician Assistant

## 2019-09-15 DIAGNOSIS — Q8719 Other congenital malformation syndromes predominantly associated with short stature: Secondary | ICD-10-CM

## 2019-09-15 DIAGNOSIS — M25552 Pain in left hip: Secondary | ICD-10-CM

## 2019-09-15 DIAGNOSIS — M545 Low back pain, unspecified: Secondary | ICD-10-CM

## 2019-09-15 DIAGNOSIS — G8929 Other chronic pain: Secondary | ICD-10-CM

## 2019-09-15 NOTE — Progress Notes (Unsigned)
referal to  

## 2019-10-11 ENCOUNTER — Other Ambulatory Visit: Payer: Self-pay

## 2019-10-11 ENCOUNTER — Encounter: Payer: Self-pay | Admitting: Orthopaedic Surgery

## 2019-10-11 ENCOUNTER — Ambulatory Visit (INDEPENDENT_AMBULATORY_CARE_PROVIDER_SITE_OTHER): Payer: PPO | Admitting: Orthopaedic Surgery

## 2019-10-11 DIAGNOSIS — M545 Low back pain, unspecified: Secondary | ICD-10-CM | POA: Insufficient documentation

## 2019-10-11 NOTE — Addendum Note (Signed)
Addended by: Erich Montane on: 10/11/2019 09:54 AM   Modules accepted: Orders

## 2019-10-11 NOTE — Progress Notes (Signed)
Office Visit Note   Patient: Richard Marks           Date of Birth: 1986-02-03           MRN: [id number] Visit Date: 10/11/2019              Requested by: Waldon Merl, PA-C [address] [place],  [place] [address] PCP: Waldon Merl, PA-C   Assessment & Plan: Visit Diagnoses:  1. Bilateral low back pain, unspecified chronicity, unspecified whether sciatica present     Plan: We will set patient up for some physical therapy recheck him in 4 to 5 weeks.  Syndrome that he has had some abnormalities of soft tissue collagen and he may have some disc herniation.  No evidence of equina equina syndrome no motor weakness although he does have nerve root tension signs.  We will set him up for some physical therapy recheck him and if he is having persistent symptoms not improving will consider diagnostic MRI imaging at that time.  Follow-Up Instructions: Return in about 4 weeks (around 11/08/2019).   Orders:  No orders of the defined types were placed in this encounter.  No orders of the defined types were placed in this encounter.     Procedures: No procedures performed   Clinical Data: No additional findings.   Subjective: Chief Complaint  Patient presents with  . Lower Back - Pain  . Right Hip - Pain  . 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  He denies any specific injuries been treated with meloxicam with some improvement.  Has more pain on his left than right side no associated bowel or bladder symptoms.  He is noticed with bending he has pain he has pain after prolonged sitting stiffness as he tries to get up.  Other than meloxicam he has not had any other treatment.  He denies fever or chills.  Review of Systems positive for current low back pain x2 months and diagnosis of [name].  Otherwise -14 point systems negative as it  pertains to HPI.   Objective: Vital Signs: BP 117/79   Pulse 69   Wt 222 lb (100.7 kg)   BMI 30.11 kg/m   Physical Exam Constitutional:      Appearance: He is well-developed.  HENT:     Head: Normocephalic and atraumatic.  Eyes:     Pupils: Pupils are equal, round, and reactive to light.  Neck:     Thyroid: No thyromegaly.     Trachea: No tracheal deviation.  Cardiovascular:     Rate and Rhythm: Normal rate.  Pulmonary:     Effort: Pulmonary effort is normal.     Breath sounds: No wheezing.  Abdominal:     General: Bowel sounds are normal.     Palpations: Abdomen is soft.  Skin:    General: Skin is warm and dry.     Capillary Refill: Capillary refill takes less than 2 seconds.  Neurological:     Mental Status: He is alert and oriented to person, place, and time.  Psychiatric:        Behavior: Behavior normal.        Thought Content: Thought content normal.        Judgment: Judgment normal.     Ortho Exam patient amatory with a short stride gait  tends to keep his anterior tib firing with toe up ambulation and slight hip flexion.  He has pain with straight leg raising on the left at 80 degrees.  Anterior tib EHL is intact reflexes are intact gastrocsoleus is strong no hip flexion, hip adductor, or quad weakness right or left.  Sensation below the legs are intact.  Mild tenderness over the greater trochanter left greater than right.  Mild sciatic notch tenderness.  Specialty Comments:  No specialty comments available.  Imaging: Recent lumbar images 09/14/2019 were normal.  Hip x-ray showed no hip osteoarthritis some loss of sphericity to the femoral heads.  Negative for acute changes.   PMFS History: Patient Active Problem List   Diagnosis Date Noted  . Low back pain 10/11/2019  . Encounter to establish care 10/20/2017  . Need for immunization against influenza 10/20/2017  . 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 [name]     Diagnosed at [facility] when patient was 33 years old    Family History  Problem Relation Age of Onset  . Diabetes Mother   . Hypertension Mother   . Hyperlipidemia Mother   . Depression Mother   . Hypertension Father   . Diabetes Father   . COPD Father   . Alcohol abuse Sister   . Asthma Sister   . Depression Sister   . Drug abuse Sister   . Hypertension Sister   . Hypertension Sister   . Depression Sister   . Diabetes Sister   . Depression Brother   . Diabetes Maternal Grandmother   . Heart disease Maternal Grandmother   . Hyperlipidemia Maternal Grandmother   . Kidney disease Maternal Grandmother   . Heart disease Maternal Grandfather   . Hypertension Maternal Grandfather   . Kidney disease Maternal Grandfather   . Arthritis Paternal Grandmother   . Diabetes Paternal Grandmother   . Heart disease Paternal Grandmother   . Hyperlipidemia Paternal Grandmother   . Hypertension Paternal Grandmother   . Kidney disease Paternal Grandmother   . Stroke Paternal Grandmother   . Asthma Paternal Grandfather   . COPD Paternal Grandfather   . Hyperlipidemia Paternal Grandfather   . Hypertension Paternal Grandfather   . Kidney disease Paternal Grandfather     Past Surgical History:  Procedure Laterality Date  . NO PAST SURGERIES     Social History   Occupational History  . Not on file  Tobacco Use  . Smoking status: Never Smoker  . Smokeless tobacco: Never Used  Substance and Sexual Activity  . Alcohol use: No  . Drug use: No  . Sexual activity: Never

## 2019-10-17 ENCOUNTER — Telehealth: Payer: Self-pay | Admitting: Orthopaedic Surgery

## 2019-10-17 NOTE — Telephone Encounter (Signed)
Pt mother called in said that her son had an appt with dr.yates on 11/25 and his mother wasn't allowed in due to covid but the pt has learning disability's so it's hard for his mother to fully understand what was said at the appt. Pt states that dr.yates told the pt he was going to send him to do physical therapy first before getting an mri even though he thinks physical therapy wont really help, but his mother is wanting to know why he needs to do physical therapy at [address] if we provide physical therapy here at our location.    [phone]

## 2019-10-17 NOTE — Telephone Encounter (Signed)
I called pt 's Mom and discussed and explained.

## 2019-10-17 NOTE — Telephone Encounter (Signed)
Please advise. Patient's mother is not understanding physical therapy referral if you do not think it will help. Also, pt referred to [facility] location. OK to change referral to here?

## 2019-10-26 ENCOUNTER — Other Ambulatory Visit: Payer: Self-pay

## 2019-10-26 ENCOUNTER — Ambulatory Visit: Payer: PPO | Attending: Orthopaedic Surgery | Admitting: Physical Therapy

## 2019-10-26 ENCOUNTER — Encounter: Payer: Self-pay | Admitting: Physical Therapy

## 2019-10-26 DIAGNOSIS — G8929 Other chronic pain: Secondary | ICD-10-CM | POA: Diagnosis not present

## 2019-10-26 DIAGNOSIS — M545 Low back pain, unspecified: Secondary | ICD-10-CM

## 2019-10-26 DIAGNOSIS — M25552 Pain in left hip: Secondary | ICD-10-CM | POA: Insufficient documentation

## 2019-10-26 DIAGNOSIS — M6281 Muscle weakness (generalized): Secondary | ICD-10-CM | POA: Insufficient documentation

## 2019-10-27 ENCOUNTER — Encounter: Payer: Self-pay | Admitting: Physical Therapy

## 2019-10-27 NOTE — Therapy (Signed)
[facility] [address] [place], [place], [address] Phone: [phone]   Fax:  [phone]  Physical Therapy Evaluation  Patient Details  Name: Richard Marks MRN: [id number] Date of Birth: 1985-12-07 Referring Provider (PT): Eldred Manges, MD   Encounter Date: 10/26/2019  PT End of Session - 10/26/19 1747    Visit Number  1    Number of Visits  8    Date for PT Re-Evaluation  12/22/19    Authorization Type  [employer]    PT Start Time  1615    PT Stop Time  1700    PT Time Calculation (min)  45 min    Activity Tolerance  Patient tolerated treatment well    Behavior During Therapy  [facility] for tasks assessed/performed       Past Medical History:  Diagnosis Date  . [name]    Diagnosed at [facility] when patient was 33 years old    Past Surgical History:  Procedure Laterality Date  . NO PAST SURGERIES      There were no vitals filed for this visit.   Subjective Assessment - 10/26/19 1617    Subjective  Patient reports left sided low back pain that can go to the hip. Started about 3 months ago but has gotten worse. Bending straight forward causes it to hurt so he has to bend over at an angle. When he is standing or sitting for extended periods he will have pain. He is also limited in how much he can lift and move at work.    Pertinent History  [name]    Limitations  Sitting;Lifting;Standing;Walking;House hold activities    How long can you sit comfortably?  30-40 minutes    How long can you stand comfortably?  Unlimited - can stand all day but will have pain toward the end of the day    How long can you walk comfortably?  Unlimited - can walk all day but will have pain toward the end of the day    Diagnostic tests  X-ray    Patient Stated Goals  Improve ability to lift and bend at work    Currently in Pain?  Yes    Pain Score  5     Pain Location  Back    Pain Orientation  Left;Lower     Pain Descriptors / Indicators  Sharp;Burning    Pain Type  Chronic pain    Pain Radiating Towards  left hip    Pain Onset  More than a month ago    Pain Frequency  Intermittent    Aggravating Factors   Bending forward, lifting    Pain Relieving Factors  Rest, medication    Effect of Pain on Daily Activities  Patient is limited in bending forward and lifting         [facility] PT Assessment - 10/27/19 0001      Assessment   Medical Diagnosis  Low back pain, left hip pain    Referring Provider (PT)  Eldred Manges, MD    Onset Date/Surgical Date  --   patient report worsened pain past 3 months   Hand Dominance  Right    Next MD Visit  None scheduled    Prior Therapy  None      Precautions   Precautions  None      Restrictions   Weight Bearing Restrictions  No      Balance Screen  Has the patient fallen in the past 6 months  No    Has the patient had a decrease in activity level because of a fear of falling?   No    Is the patient reluctant to leave their home because of a fear of falling?   No      Home Public house manager residence    Living Arrangements  Parent      Prior Function   Level of Independence  Independent with basic ADLs    Vocation  Full time employment    Vocation Requirements  Patient works in a garage changing oil on cars      Cognition   Overall Cognitive Status  Within Functional Limits for tasks assessed      Observation/Other Assessments   Focus on Therapeutic Outcomes (FOTO)   51% limitation      Sensation   Light Touch  Appears Intact      Posture/Postural Control   Posture Comments  Patient exhibits a rounded shoulder and forward head posture      ROM / Strength   AROM / PROM / Strength  AROM;PROM;Strength      AROM   AROM Assessment Site  Lumbar    Lumbar Flexion  75% - increased left sided low back pain with fingertips to mid-shin    Lumbar Extension  WFL    Lumbar - Right Side Bend  WFL    Lumbar - Left Side  Bend  WFL    Lumbar - Right Rotation  WFL    Lumbar - Left Rotation  WFL - increased left sided low back pain      PROM   Overall PROM Comments  Hip PROM grossly WFL and non-painful      Strength   Strength Assessment Site  Hip;Knee    Right/Left Hip  Right;Left    Right Hip Flexion  4/5    Right Hip Extension  4-/5    Right Hip ABduction  4-/5    Left Hip Flexion  4/5    Left Hip Extension  4-/5   increased left lateral hip pain   Left Hip ABduction  4-/5   increased left lateral hip pain   Right/Left Knee  Right;Left    Right Knee Flexion  5/5    Right Knee Extension  5/5    Left Knee Flexion  5/5    Left Knee Extension  5/5      Palpation   Spinal mobility  Hypomobility thoracic region, lumbar WFL but reports increased local discomfort with CPAs to lower lumbar    SI assessment   Negative    Palpation comment  TTP lateral glut region      Special Tests   Other special tests  SLR and slump negative, scour and FADIR negative, FABER mildly positive on left      Transfers   Transfers  Independent with all Transfers                Objective measurements completed on examination: See above findings.      [facility] Adult PT Treatment/Exercise - 10/27/19 0001      Exercises   Exercises  Lumbar      Lumbar Exercises: Stretches   Other Lumbar Stretch Exercise  Child's pose stretch 2x30 sec    Other Lumbar Stretch Exercise  Side lying open book stretch x10 each side      Lumbar Exercises: Supine   Bridge  10  reps      Lumbar Exercises: Sidelying   Clam  10 reps    Clam Limitations  yellow band      Lumbar Exercises: Quadruped   Opposite Arm/Leg Raise  5 reps;5 seconds             PT Education - 10/26/19 1752    Education Details  Exam findings, differential diagnoses, POC, HEP    Person(s) Educated  Patient    Methods  Explanation;Demonstration;Tactile cues;Verbal cues;Handout    Comprehension  Verbalized understanding;Returned demonstration;Verbal  cues required;Need further instruction;Tactile cues required       PT Short Term Goals - 10/27/19 1100      PT SHORT TERM GOAL #1   Title  Patient will be I with HEP to maintain progress in PT.    Time  4    Period  Weeks    Status  New    Target Date  11/23/19      PT SHORT TERM GOAL #2   Title  Patient will understand and demonstrate proper lifting mechanics to reduce pain at work.    Time  4    Period  Weeks    Status  New    Target Date  11/23/19        PT Long Term Goals - 10/27/19 1101      PT LONG TERM GOAL #1   Title  Patient will exhibit full lumbar flexion without pain to reduce limitation at work.    Time  8    Period  Weeks    Status  New    Target Date  12/21/19      PT LONG TERM GOAL #2   Title  Patient will demonstrate lumbar motion WFL without pain to allow for improved movement and lifting ability.    Time  8    Period  Weeks    Status  New    Target Date  12/21/19      PT LONG TERM GOAL #3   Title  Patient will exhibit improve core and hip strenght of > or = 4+/5 to allow for improve lumbar and hip control with lifting.    Time  8    Period  Weeks    Status  New    Target Date  12/21/19      PT LONG TERM GOAL #4   Title  Patient will report improved functional level < or = 34% FOTO    Time  8    Period  Weeks    Status  New    Target Date  12/21/19             Plan - 10/26/19 1749    Clinical Impression Statement  Patient presents to PT with left sided low back and lateral hip pain. Its unclear whether the left hip pain is referred from back, it seems it could be gluetal tendinopathy vs. referral. His main deficit seems to be core and hip strength deficit with poor movement quality that seems to be contributing to his pain with bending forward and lifting. He was provided with exercises to improve his mobility and strength wih good tolerance. He did require cueing for core activation and maintaining neutral lumbar spine. He would benefit  from continued skilled PT to progress his strength and lifting ability so he doesnt have any limitations at work.    Personal Factors and Comorbidities  Fitness;Past/Current Experience;Social Background;Comorbidity 1    Comorbidities  [name]  Examination-Activity Limitations  Bend;Lift    Examination-Participation Restrictions  Community Activity    Stability/Clinical Decision Making  Stable/Uncomplicated    Clinical Decision Making  Low    Rehab Potential  Good    PT Frequency  1x / week    PT Duration  6 weeks    PT Treatment/Interventions  ADLs/Self Care Home Management;Cryotherapy;Electrical Stimulation;Moist Heat;Traction;Functional mobility training;Therapeutic activities;Therapeutic exercise;Neuromuscular re-education;Patient/family education;Manual techniques;Taping;Dry needling;Passive range of motion;Joint Manipulations;Spinal Manipulations    PT Next Visit Plan  Assess HEP, lifting mechanics and posture, progress core and hip strengthening    PT Home Exercise Plan  Child's pose stretch, side lying thoracic-lumbar open book stretch, bridge, clamshell with yellow, bird dog hold    Consulted and Agree with Plan of Care  Patient       Patient will benefit from skilled therapeutic intervention in order to improve the following deficits and impairments:  Pain, Decreased strength, Decreased activity tolerance, Improper body mechanics, Decreased range of motion  Visit Diagnosis: Chronic left-sided low back pain, unspecified whether sciatica present  Pain in left hip  Muscle weakness (generalized)     Problem List Patient Active Problem List   Diagnosis Date Noted  . Low back pain 10/11/2019  . Encounter to establish care 10/20/2017  . Need for immunization against influenza 10/20/2017  . [name] 07/20/2013    Rosana Hoesampbell Liane Tribbey, PT, DPT, LAT, ATC 10/27/19  11:15 AM Phone: [phone] Fax: [phone]   [facility]  [facility] [address] [place], [place], [address] Phone: [phone]   Fax:  [phone]  Name: Richard Marks MRN: [id number] Date of Birth: Nov 26, 1985

## 2019-10-27 NOTE — Addendum Note (Signed)
Addended by: Bobette Mo on: 10/27/2019 11:17 AM   Modules accepted: Orders

## 2019-10-27 NOTE — Patient Instructions (Signed)
Access Code: [email or web address]  URL: [email or web address]  Date: 10/27/2019  Prepared by: Hilda Blades   Exercises Child's Pose Stretch - 2 reps - 30 seconds hold - 1x daily Sidelying Thoracic Lumbar Rotation - 10 reps - 2 sets - 1x daily Supine Bridge - 10 reps - 2 sets - 1x daily Clamshell with Resistance - 10 reps - 2 sets - 1x daily Bird Dog - 10 reps - 2 sets - 5 seconds hold - 1x daily

## 2019-11-03 ENCOUNTER — Encounter

## 2019-11-08 ENCOUNTER — Ambulatory Visit: Payer: PPO | Admitting: Physical Therapy

## 2019-11-08 ENCOUNTER — Other Ambulatory Visit: Payer: Self-pay

## 2019-11-08 DIAGNOSIS — M6281 Muscle weakness (generalized): Secondary | ICD-10-CM

## 2019-11-08 DIAGNOSIS — M25552 Pain in left hip: Secondary | ICD-10-CM

## 2019-11-08 DIAGNOSIS — G8929 Other chronic pain: Secondary | ICD-10-CM

## 2019-11-08 DIAGNOSIS — M545 Low back pain: Secondary | ICD-10-CM | POA: Diagnosis not present

## 2019-11-08 NOTE — Therapy (Signed)
[place], [place], [address] Phone: [phone]   Fax:  [phone]  Physical Therapy Treatment  Patient Details  Name: Richard Marks MRN: [id number] Date of Birth: 1986/08/15 Referring Provider (PT): Marybelle Killings, MD   Encounter Date: 11/08/2019  PT End of Session - 11/08/19 1640    Visit Number  2    Number of Visits  8    Date for PT Re-Evaluation  12/22/19    Authorization Type  [employer]    PT Start Time  1640   pt arrived 10 min late   PT Stop Time  1713    PT Time Calculation (min)  33 min    Activity Tolerance  Patient tolerated treatment well    Behavior During Therapy  [facility] for tasks assessed/performed       Past Medical History:  Diagnosis Date  . [name]    Diagnosed at [facility] when patient was 33 years old    Past Surgical History:  Procedure Laterality Date  . NO PAST SURGERIES      There were no vitals filed for this visit.  Subjective Assessment - 11/08/19 1645    Subjective  " I am doing about the same as the last session, still at a 4-5/10 in the back'    Patient Stated Goals  Improve ability to lift and bend at work    Currently in Pain?  Yes    Pain Score  5     Pain Location  Back    Pain Orientation  Left    Pain Onset  More than a month ago    Pain Frequency  Intermittent    Aggravating Factors   bending forward, lifting         [facility] PT Assessment - 11/08/19 0001      Assessment   Medical Diagnosis  Low back pain, left hip pain    Referring Provider (PT)  Marybelle Killings, MD                   [facility] Adult PT Treatment/Exercise - 11/08/19 1702      Self-Care   Self-Care  Other Self-Care Comments    Other Self-Care Comments   antomy of the SIJ on the R and effects of surrounding musculature      Lumbar Exercises: Stretches   Active Hamstring Stretch  2 reps;Right;30 seconds;Other (comment)   PNF contract/ relax  stretch     Lumbar Exercises: Aerobic   Nustep  L6 x 5 min UE/LE      Lumbar Exercises: Seated   Other Seated Lumbar Exercises  dead lift from 6 inch step using 10# kettlebell, max verbal cues and tactile cues for proper form      Lumbar Exercises: Supine   Straight Leg Raise  10 reps   x 2 on the RLE     Lumbar Exercises: Sidelying   Clam  Both;15 reps;1 second   with red theraband     Manual Therapy   Manual Therapy  Muscle Energy Technique    Muscle Energy Technique  resisted R hip flexion 5 x 10 sec hold             PT Education - 11/08/19 1717    Education Details  updated HEP today and educated about SIJ and surrouding effect of muscle and efficient lifting mechanics.    Person(s) Educated  Patient    Methods  Explanation;Verbal cues;Handout    Comprehension  Verbalized understanding;Verbal cues required       PT Short Term Goals - 10/27/19 1100      PT SHORT TERM GOAL #1   Title  Patient will be I with HEP to maintain progress in PT.    Time  4    Period  Weeks    Status  New    Target Date  11/23/19      PT SHORT TERM GOAL #2   Title  Patient will understand and demonstrate proper lifting mechanics to reduce pain at work.    Time  4    Period  Weeks    Status  New    Target Date  11/23/19        PT Long Term Goals - 10/27/19 1101      PT LONG TERM GOAL #1   Title  Patient will exhibit full lumbar flexion without pain to reduce limitation at work.    Time  8    Period  Weeks    Status  New    Target Date  12/21/19      PT LONG TERM GOAL #2   Title  Patient will demonstrate lumbar motion WFL without pain to allow for improved movement and lifting ability.    Time  8    Period  Weeks    Status  New    Target Date  12/21/19      PT LONG TERM GOAL #3   Title  Patient will exhibit improve core and hip strenght of > or = 4+/5 to allow for improve lumbar and hip control with lifting.    Time  8    Period  Weeks    Status  New    Target Date   12/21/19      PT LONG TERM GOAL #4   Title  Patient will report improved functional level < or = 34% FOTO    Time  8    Period  Weeks    Status  New    Target Date  12/21/19            Plan - 11/08/19 1719    Clinical Impression Statement  pt reports consistency with his HE pand noted continued pain rated at 3-4/10 located at the R SIJ. following hamstring stretching and MET for the R hip flexors he exhibited increased trunk flexion and decreased pain. Practiced lifting mechanics which he require max cues for proper form due to utilizing back and demonstrates increased thoracic kyphosis which he can correct with cues. pt reported decreased pain to 2-3/10.    PT Treatment/Interventions  ADLs/Self Care Home Management;Cryotherapy;Electrical Stimulation;Moist Heat;Traction;Functional mobility training;Therapeutic activities;Therapeutic exercise;Neuromuscular re-education;Patient/family education;Manual techniques;Taping;Dry needling;Passive range of motion;Joint Manipulations;Spinal Manipulations    PT Next Visit Plan  Assess HEP, lifting mechanics and posture, progress core and hip strengthening    PT Home Exercise Plan  Child's pose stretch, side lying thoracic-lumbar open book stretch, bridge, clamshell with yellow, bird dog hold    Consulted and Agree with Plan of Care  Patient       Patient will benefit from skilled therapeutic intervention in order to improve the following deficits and impairments:  Pain, Decreased strength, Decreased activity tolerance, Improper body mechanics, Decreased range of motion  Visit Diagnosis: Chronic left-sided low back pain, unspecified whether sciatica present  Pain in left hip  Muscle weakness (generalized)     Problem List Patient Active Problem List   Diagnosis  Date Noted  . Low back pain 10/11/2019  . Encounter to establish care 10/20/2017  . Need for immunization against influenza 10/20/2017  . [name] 07/20/2013    Starr Lake PT, DPT, LAT, ATC  11/08/19  5:25 PM      [facility] [address] [place], [place], [address] Phone: [phone]   Fax:  [phone]  Name: Richard Marks MRN: [id number] Date of Birth: [birth year]

## 2019-11-16 ENCOUNTER — Other Ambulatory Visit: Payer: Self-pay

## 2019-11-16 ENCOUNTER — Encounter: Payer: Self-pay | Admitting: Physical Therapy

## 2019-11-16 ENCOUNTER — Ambulatory Visit: Payer: PPO | Admitting: Physical Therapy

## 2019-11-16 DIAGNOSIS — M545 Low back pain, unspecified: Secondary | ICD-10-CM

## 2019-11-16 DIAGNOSIS — M6281 Muscle weakness (generalized): Secondary | ICD-10-CM

## 2019-11-16 DIAGNOSIS — G8929 Other chronic pain: Secondary | ICD-10-CM

## 2019-11-16 DIAGNOSIS — M25552 Pain in left hip: Secondary | ICD-10-CM

## 2019-11-16 NOTE — Therapy (Signed)
[place], [place], [address] Phone: [phone]   Fax:  [phone]  Physical Therapy Treatment  Patient Details  Name: Richard Marks MRN: [id number] Date of Birth: 15-Apr-1986 Referring Provider (PT): Marybelle Killings, MD   Encounter Date: 11/16/2019  PT End of Session - 11/16/19 1622    Visit Number  3    Number of Visits  8    Date for PT Re-Evaluation  12/22/19    Authorization Type  [employer]    PT Start Time  8841    PT Stop Time  1655    PT Time Calculation (min)  40 min    Activity Tolerance  Patient tolerated treatment well    Behavior During Therapy  [facility] for tasks assessed/performed       Past Medical History:  Diagnosis Date  . [name]    Diagnosed at [facility] when patient was 33 years old    Past Surgical History:  Procedure Laterality Date  . NO PAST SURGERIES      There were no vitals filed for this visit.  Subjective Assessment - 11/16/19 1620    Subjective  Patient reports he is doing well. 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    Currently in Pain?  No/denies                       [facility] Adult PT Treatment/Exercise - 11/16/19 0001      Exercises   Exercises  Lumbar      Lumbar Exercises: Stretches   Active Hamstring Stretch  2 reps;30 seconds;Right    Active Hamstring Stretch Limitations  seated EOB,       Lumbar Exercises: Aerobic   Nustep  L6 x 5 min UE/LE      Lumbar Exercises: Standing   Row  15 reps   2 sets   Theraband Level (Row)  Level 4 (Blue)    Row Limitations  max verbal and tactile cueing for posture and proper technique to avoid shrug and forward rounded shoulders    Other Standing Lumbar Exercises  Dead lift from 8" step using 15# kettlebell x10, 25# kettlebell x10, max verbal and tactile cues for proper form      Lumbar Exercises: Supine   Straight Leg Raise  10 reps    right only     Lumbar Exercises: Prone   Opposite Arm/Leg Raise  10 reps;5 seconds    Opposite Arm/Leg Raise Limitations  superman, cued for technique      Lumbar Exercises: Quadruped   Opposite Arm/Leg Raise  10 reps;5 seconds   each   Opposite Arm/Leg Raise Limitations  bird dog, cued for technique             PT Education - 11/16/19 1621    Education Details  HEP, lifting mechanics    Person(s) Educated  Patient    Methods  Explanation;Demonstration;Verbal cues;Handout    Comprehension  Verbalized understanding;Returned demonstration;Verbal cues required;Need further instruction       PT Short Term Goals - 10/27/19 1100      PT SHORT TERM GOAL #1   Title  Patient will be I with HEP to maintain progress in PT.    Time  4    Period  Weeks    Status  New    Target Date  11/23/19      PT  SHORT TERM GOAL #2   Title  Patient will understand and demonstrate proper lifting mechanics to reduce pain at work.    Time  4    Period  Weeks    Status  New    Target Date  11/23/19        PT Long Term Goals - 10/27/19 1101      PT LONG TERM GOAL #1   Title  Patient will exhibit full lumbar flexion without pain to reduce limitation at work.    Time  8    Period  Weeks    Status  New    Target Date  12/21/19      PT LONG TERM GOAL #2   Title  Patient will demonstrate lumbar motion WFL without pain to allow for improved movement and lifting ability.    Time  8    Period  Weeks    Status  New    Target Date  12/21/19      PT LONG TERM GOAL #3   Title  Patient will exhibit improve core and hip strenght of > or = 4+/5 to allow for improve lumbar and hip control with lifting.    Time  8    Period  Weeks    Status  New    Target Date  12/21/19      PT LONG TERM GOAL #4   Title  Patient will report improved functional level < or = 34% FOTO    Time  8    Period  Weeks    Status  New    Target Date  12/21/19            Plan - 11/16/19 1622    Clinical  Impression Statement  Patient continues to exhibit impaired lifting technique and requires max cueing to maintain neutral spine and avoid flexing when he lifts from the floor. This visit focused on improving posterior chain strength in order to improve lifting mechanics which seems to be causing his right sided back pain. He would benefit from continued skilled PT to porgress his strength and lifting ability to reduce right sided back and hip pain.    PT Treatment/Interventions  ADLs/Self Care Home Management;Cryotherapy;Electrical Stimulation;Moist Heat;Traction;Functional mobility training;Therapeutic activities;Therapeutic exercise;Neuromuscular re-education;Patient/family education;Manual techniques;Taping;Dry needling;Passive range of motion;Joint Manipulations;Spinal Manipulations    PT Next Visit Plan  Assess HEP, lifting mechanics and posture, progress core and hip strengthening    PT Home Exercise Plan  Child's pose stretch, side lying thoracic-lumbar open book stretch, bridge, clamshell with red, bird dog hold, seated or supine hamstring stretch, SLR, row with blue    Consulted and Agree with Plan of Care  Patient       Patient will benefit from skilled therapeutic intervention in order to improve the following deficits and impairments:  Pain, Decreased strength, Decreased activity tolerance, Improper body mechanics, Decreased range of motion  Visit Diagnosis: Chronic left-sided low back pain, unspecified whether sciatica present  Pain in left hip  Muscle weakness (generalized)     Problem List Patient Active Problem List   Diagnosis Date Noted  . Low back pain 10/11/2019  . Encounter to establish care 10/20/2017  . Need for immunization against influenza 10/20/2017  . [name] 07/20/2013    Rosana Hoesampbell Overton Boggus, PT, DPT, LAT, ATC 11/16/19  5:05 PM Phone: [phone] Fax: [phone]   [facility] [address] [place], [place], [address] Phone: [phone]   Fax:  [phone]  Name: HILARY PUNDT MRN: [id number] Date of Birth: April 22, 1986

## 2019-11-23 ENCOUNTER — Ambulatory Visit: Payer: PPO | Attending: Orthopaedic Surgery | Admitting: Physical Therapy

## 2019-11-23 ENCOUNTER — Other Ambulatory Visit: Payer: Self-pay

## 2019-11-23 ENCOUNTER — Encounter: Payer: Self-pay | Admitting: Physical Therapy

## 2019-11-23 DIAGNOSIS — M25552 Pain in left hip: Secondary | ICD-10-CM | POA: Diagnosis not present

## 2019-11-23 DIAGNOSIS — M545 Low back pain, unspecified: Secondary | ICD-10-CM

## 2019-11-23 DIAGNOSIS — M6281 Muscle weakness (generalized): Secondary | ICD-10-CM

## 2019-11-23 DIAGNOSIS — G8929 Other chronic pain: Secondary | ICD-10-CM | POA: Insufficient documentation

## 2019-11-23 NOTE — Therapy (Signed)
[place], [place], [address] Phone: [phone]   Fax:  [phone]  Physical Therapy Treatment  Patient Details  Name: Richard Marks MRN: [id number] Date of Birth: 1986/02/11 Referring Provider (PT): Marybelle Killings, MD   Encounter Date: 11/23/2019  PT End of Session - 11/23/19 1622    Visit Number  4    Number of Visits  8    Date for PT Re-Evaluation  12/22/19    Authorization Type  [employer]    PT Start Time  5993    PT Stop Time  1655    PT Time Calculation (min)  40 min    Activity Tolerance  Patient tolerated treatment well    Behavior During Therapy  [facility] for tasks assessed/performed       Past Medical History:  Diagnosis Date  . [name]    Diagnosed at [facility] when patient was 34 years old    Past Surgical History:  Procedure Laterality Date  . NO PAST SURGERIES      There were no vitals filed for this visit.  Subjective Assessment - 11/23/19 1620    Subjective  Patient reports he is doing well. He denies any pain at this time and feelsd he is improving. He does notes that on days he uses it more it does still hurt.    Currently in Pain?  No/denies         [facility] PT Assessment - 11/23/19 0001      Strength   Right Hip Flexion  4/5    Right Hip Extension  4/5    Right Hip ABduction  4/5    Left Hip Flexion  4/5    Left Hip Extension  4/5    Left Hip ABduction  4/5                   [facility] Adult PT Treatment/Exercise - 11/23/19 0001      Exercises   Exercises  Lumbar      Lumbar Exercises: Stretches   Active Hamstring Stretch  2 reps;30 seconds    Active Hamstring Stretch Limitations  seated edge of mat      Lumbar Exercises: Aerobic   Nustep  L6 x 5 min UE/LE      Lumbar Exercises: Standing   Other Standing Lumbar Exercises  Dead lift from 8" step using 25# kettlebell 2x10, 30# x10, max verbal and tactile cues for proper form       Lumbar Exercises: Sidelying   Other Sidelying Lumbar Exercises  Side plank on knees 10 sec hold 2x5      Lumbar Exercises: Prone   Opposite Arm/Leg Raise  --    Opposite Arm/Leg Raise Limitations  --    Other Prone Lumbar Exercises  Prone superman with arms by side 5 sec hold x10 - cued for posterior chain activation      Lumbar Exercises: Quadruped   Opposite Arm/Leg Raise  10 reps;5 seconds   2x5 each   Opposite Arm/Leg Raise Limitations  bird dog, cued for technique             PT Education - 11/23/19 1622    Education Details  HEP, lifting mechanics    Person(s) Educated  Patient    Methods  Explanation;Demonstration;Verbal cues    Comprehension  Verbalized understanding;Returned demonstration;Verbal cues required;Need further instruction       PT Short Term Goals - 11/23/19 1706  PT SHORT TERM GOAL #1   Title  Patient will be I with HEP to maintain progress in PT.    Time  4    Period  Weeks    Status  On-going    Target Date  11/23/19      PT SHORT TERM GOAL #2   Title  Patient will understand and demonstrate proper lifting mechanics to reduce pain at work.    Time  4    Period  Weeks    Status  On-going    Target Date  11/23/19        PT Long Term Goals - 10/27/19 1101      PT LONG TERM GOAL #1   Title  Patient will exhibit full lumbar flexion without pain to reduce limitation at work.    Time  8    Period  Weeks    Status  New    Target Date  12/21/19      PT LONG TERM GOAL #2   Title  Patient will demonstrate lumbar motion WFL without pain to allow for improved movement and lifting ability.    Time  8    Period  Weeks    Status  New    Target Date  12/21/19      PT LONG TERM GOAL #3   Title  Patient will exhibit improve core and hip strenght of > or = 4+/5 to allow for improve lumbar and hip control with lifting.    Time  8    Period  Weeks    Status  New    Target Date  12/21/19      PT LONG TERM GOAL #4   Title  Patient will  report improved functional level < or = 34% FOTO    Time  8    Period  Weeks    Status  New    Target Date  12/21/19            Plan - 11/23/19 1623    Clinical Impression Statement  Patient continues to exhibit right sided hip/back pain with increased lifting activity, but he seems to be improving overall. He continues to exhibit increased hamstring tightness and poor lifting mechanics with increased kyphotic posture. His glue strength has improved since eval but continues to exhibit difficulty with core strengthening exercises. 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    PT Treatment/Interventions  ADLs/Self Care Home Management;Cryotherapy;Electrical Stimulation;Moist Heat;Traction;Functional mobility training;Therapeutic activities;Therapeutic exercise;Neuromuscular re-education;Patient/family education;Manual techniques;Taping;Dry needling;Passive range of motion;Joint Manipulations;Spinal Manipulations    PT Next Visit Plan  Assess HEP, lifting mechanics and posture, progress core and hip strengthening    PT Home Exercise Plan  Child's pose stretch, side lying thoracic-lumbar open book stretch, seated hamstring stretch, bridge, side plank on knees (can also keep doing clamshell with red), bird dog hold, SLR, row with blue    Consulted and Agree with Plan of Care  Patient       Patient will benefit from skilled therapeutic intervention in order to improve the following deficits and impairments:  Pain, Decreased strength, Decreased activity tolerance, Improper body mechanics, Decreased range of motion  Visit Diagnosis: Chronic left-sided low back pain, unspecified whether sciatica present  Pain in left hip  Muscle weakness (generalized)     Problem List Patient Active Problem List   Diagnosis Date Noted  . Low back pain 10/11/2019  . Encounter  to establish care 10/20/2017  . Need  for immunization against influenza 10/20/2017  . 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

## 2019-11-30 ENCOUNTER — Encounter: Payer: Self-pay | Admitting: Physical Therapy

## 2019-11-30 ENCOUNTER — Other Ambulatory Visit: Payer: Self-pay

## 2019-11-30 ENCOUNTER — Ambulatory Visit: Payer: PPO | Admitting: Physical Therapy

## 2019-11-30 DIAGNOSIS — M6281 Muscle weakness (generalized): Secondary | ICD-10-CM

## 2019-11-30 DIAGNOSIS — M545 Low back pain, unspecified: Secondary | ICD-10-CM

## 2019-11-30 DIAGNOSIS — G8929 Other chronic pain: Secondary | ICD-10-CM

## 2019-11-30 DIAGNOSIS — M25552 Pain in left hip: Secondary | ICD-10-CM

## 2019-11-30 NOTE — Therapy (Signed)
[place], [place], [address] Phone: [phone]   Fax:  [phone]  Physical Therapy Treatment  Patient Details  Name: Richard Marks MRN: [id number] Date of Birth: 1986/05/14 Referring Provider (PT): Marybelle Killings, MD   Encounter Date: 11/30/2019  PT End of Session - 11/30/19 0818    Visit Number  5    Number of Visits  8    Date for PT Re-Evaluation  12/22/19    Authorization Type  [employer]    PT Start Time  0800    PT Stop Time  0843    PT Time Calculation (min)  43 min    Activity Tolerance  Patient tolerated treatment well    Behavior During Therapy  [facility] for tasks assessed/performed       Past Medical History:  Diagnosis Date  . [name]    Diagnosed at [facility] when patient was 34 years old    Past Surgical History:  Procedure Laterality Date  . NO PAST SURGERIES      There were no vitals filed for this visit.  Subjective Assessment - 11/30/19 0806    Subjective  Patient reports he was a little sore after the last visit. He is having minor pain today across his upper buttock area. He reported intrest in tirgger point dry needling to adress his pain    Pertinent History  [name]    Limitations  Sitting;Lifting;Standing;Walking;House hold activities    How long can you sit comfortably?  30-40 minutes    How long can you stand comfortably?  Unlimited - can stand all day but will have pain toward the end of the day    How long can you walk comfortably?  Unlimited - can walk all day but will have pain toward the end of the day    Diagnostic tests  X-ray    Patient Stated Goals  Improve ability to lift and bend at work    Currently in Pain?  Yes    Pain Score  2     Pain Location  Buttocks    Pain Orientation  Left    Pain Type  Chronic pain    Pain Radiating Towards  left hip    Pain Onset  More than a month ago    Pain Frequency  Intermittent    Aggravating Factors   bending forward and lifting    Pain Relieving Factors  rest and medication    Effect of Pain on Daily Activities  limited bending forward and lifting                       [facility] Adult PT Treatment/Exercise - 11/30/19 0001      Self-Care   Other Self-Care Comments   reviewed lifting techniuqe. Talked about using a split stance when putting the tire on the axl. Also talked about keeping the load close. Patient demonstrated good technique with striaght kettle bell lift.       Lumbar Exercises: Stretches   Active Hamstring Stretch  2 reps;30 seconds    Active Hamstring Stretch Limitations  seated edge of mat    Piriformis Stretch  2 reps;20 seconds      Lumbar Exercises: Aerobic   Nustep  L6 x 5 min UE/LE      Lumbar Exercises: Standing   Row  15 reps   2 sets   Theraband Level (Row)  Level 4 (Blue)  Other Standing Lumbar Exercises  Dead lift from 8" step using 25# kettlebell 2x10, 30# x10, max verbal and tactile cues for proper form      Lumbar Exercises: Supine   Bridge  --    Bridge Limitations  2x10     Straight Leg Raise  10 reps   right only     Lumbar Exercises: Sidelying   Other Sidelying Lumbar Exercises  Side plank on knees 10 sec hold 2x5      Lumbar Exercises: Quadruped   Opposite Arm/Leg Raise  10 reps;5 seconds   2x5 each   Opposite Arm/Leg Raise Limitations  bird dog, cued for technique      Manual Therapy   Manual Therapy  Soft tissue mobilization    Manual therapy comments  skilled palpation of trigger points     Soft tissue mobilization  to lower lumbar parapsinals foilling dry needling              PT Education - 11/30/19 0808    Education Details  benefits and risk of TPDN    Person(s) Educated  Patient    Methods  Explanation    Comprehension  Returned demonstration;Verbal cues required;Verbalized understanding;Tactile cues required       PT Short Term Goals - 11/30/19 0859      PT SHORT TERM GOAL #1    Title  Patient will be I with HEP to maintain progress in PT.    Time  4    Period  Weeks    Status  On-going    Target Date  11/23/19      PT SHORT TERM GOAL #2   Title  Patient will understand and demonstrate proper lifting mechanics to reduce pain at work.    Time  4    Period  Weeks    Status  On-going    Target Date  11/23/19        PT Long Term Goals - 10/27/19 1101      PT LONG TERM GOAL #1   Title  Patient will exhibit full lumbar flexion without pain to reduce limitation at work.    Time  8    Period  Weeks    Status  New    Target Date  12/21/19      PT LONG TERM GOAL #2   Title  Patient will demonstrate lumbar motion WFL without pain to allow for improved movement and lifting ability.    Time  8    Period  Weeks    Status  New    Target Date  12/21/19      PT LONG TERM GOAL #3   Title  Patient will exhibit improve core and hip strenght of > or = 4+/5 to allow for improve lumbar and hip control with lifting.    Time  8    Period  Weeks    Status  New    Target Date  12/21/19      PT LONG TERM GOAL #4   Title  Patient will report improved functional level < or = 34% FOTO    Time  8    Period  Weeks    Status  New    Target Date  12/21/19            Plan - 11/30/19 0819    Clinical Impression Statement  Patient had initially rpeorted some pain in his gluteal this morning but upon inspection he had no trigger points in  his gluteals. He had some minor trigger points near his spine. Therapy needled 2 spots and got a good twtitch response on the second needle. He tolerated exercises well. He reported minor soreness follwinfg needling and lifting in his paraspinals but no more then he initially came in with.    Personal Factors and Comorbidities  Fitness;Past/Current Experience;Social Background;Comorbidity 1    Comorbidities  [name]    Examination-Activity Limitations  Bend;Lift    Stability/Clinical Decision Making  Stable/Uncomplicated     Clinical Decision Making  Low    Rehab Potential  Good    PT Frequency  1x / week    PT Duration  6 weeks    PT Treatment/Interventions  ADLs/Self Care Home Management;Cryotherapy;Electrical Stimulation;Moist Heat;Traction;Functional mobility training;Therapeutic activities;Therapeutic exercise;Neuromuscular re-education;Patient/family education;Manual techniques;Taping;Dry needling;Passive range of motion;Joint Manipulations;Spinal Manipulations    PT Next Visit Plan  Assess HEP, lifting mechanics and posture, progress core and hip strengthening; assess tolerance to needling    PT Home Exercise Plan  Child's pose stretch, side lying thoracic-lumbar open book stretch, seated hamstring stretch, bridge, side plank on knees (can also keep doing clamshell with red), bird dog hold, SLR, row with blue    Consulted and Agree with Plan of Care  Patient       Patient will benefit from skilled therapeutic intervention in order to improve the following deficits and impairments:  Pain, Decreased strength, Decreased activity tolerance, Improper body mechanics, Decreased range of motion  Visit Diagnosis: Chronic left-sided low back pain, unspecified whether sciatica present  Pain in left hip  Muscle weakness (generalized)     Problem List Patient Active Problem List   Diagnosis Date Noted  . Low back pain 10/11/2019  . Encounter to establish care 10/20/2017  . Need for immunization against influenza 10/20/2017  . [name] 07/20/2013    Dessie Coma PT DPT  11/30/2019, 9:16 AM  [facility] [address] [place], [place], [address] Phone: [phone]   Fax:  [phone]  Name: Richard Marks MRN: [id number] Date of Birth: 10-12-1986

## 2019-12-07 ENCOUNTER — Other Ambulatory Visit: Payer: Self-pay

## 2019-12-07 ENCOUNTER — Encounter: Payer: Self-pay | Admitting: Physical Therapy

## 2019-12-07 ENCOUNTER — Ambulatory Visit: Payer: PPO | Admitting: Physical Therapy

## 2019-12-07 DIAGNOSIS — M6281 Muscle weakness (generalized): Secondary | ICD-10-CM

## 2019-12-07 DIAGNOSIS — M25552 Pain in left hip: Secondary | ICD-10-CM

## 2019-12-07 DIAGNOSIS — M545 Low back pain, unspecified: Secondary | ICD-10-CM

## 2019-12-07 DIAGNOSIS — G8929 Other chronic pain: Secondary | ICD-10-CM

## 2019-12-08 NOTE — Therapy (Signed)
[facility] Outpatient Rehabilitation [facility] [address] [place], [place], [address] Phone: [phone]   Fax:  [phone]  Physical Therapy Treatment  Patient Details  Name: Richard Marks MRN: [id number] Date of Birth: 1986-08-24 Referring Provider (PT): Eldred Manges, MD   Encounter Date: 12/07/2019  PT End of Session - 12/07/19 1704    Visit Number  6    Number of Visits  8    Date for PT Re-Evaluation  12/22/19    Authorization Type  [employer]    PT Start Time  1600    PT Stop Time  1640    PT Time Calculation (min)  40 min    Activity Tolerance  Patient tolerated treatment well    Behavior During Therapy  [facility] for tasks assessed/performed       Past Medical History:  Diagnosis Date  . 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 NO PAST SURGERIES      There were no vitals filed for this visit.  Subjective Assessment - 12/07/19 1702    Subjective  Patient reports the dry needling he had last visit helped for a few days but he started having the pain today. He is feeling alright today, he still is having soreness on the lower back.    Currently in Pain?  Yes    Pain Score  2     Pain Location  Back    Pain Orientation  Left    Pain Descriptors / Indicators  Sore    Pain Type  Chronic pain    Pain Onset  More than a month ago    Pain Frequency  Intermittent                       [facility] Adult PT Treatment/Exercise - 12/08/19 0001      Exercises   Exercises  Lumbar      Lumbar Exercises: Stretches   Passive Hamstring Stretch  3 reps;30 seconds    Passive Hamstring Stretch Limitations  supine with strap    Other Lumbar Stretch Exercise  Trialed left QL stretch in doorway      Lumbar Exercises: Aerobic   Nustep  L6 x 5 min UE/LE      Lumbar Exercises: Standing   Other Standing Lumbar Exercises  Dead lift from 8" step  using 45# 3x8, max verbal and tactile cues for proper form   patient report mild left sided low back pulling     Lumbar Exercises: Supine   Bridge Limitations  2x10       Lumbar Exercises: Prone   Other Prone Lumbar Exercises  Prone superman with arms by side 5 sec hold x10 - cued for posterior chain activation      Lumbar Exercises: Quadruped   Opposite Arm/Leg Raise  10 reps;5 seconds    Opposite Arm/Leg Raise Limitations  bird dog, cued for technique             PT Education - 12/07/19 1704    Education Details  HEP    Person(s) Educated  Patient    Methods  Explanation;Demonstration;Verbal cues;Tactile cues    Comprehension  Verbalized understanding;Returned demonstration;Verbal cues required;Tactile cues required;Need further instruction       PT Short Term Goals - 11/30/19 0859      PT SHORT TERM GOAL #1  Title  Patient will be I with HEP to maintain progress in PT.    Time  4    Period  Weeks    Status  On-going    Target Date  11/23/19      PT SHORT TERM GOAL #2   Title  Patient will understand and demonstrate proper lifting mechanics to reduce pain at work.    Time  4    Period  Weeks    Status  On-going    Target Date  11/23/19        PT Long Term Goals - 10/27/19 1101      PT LONG TERM GOAL #1   Title  Patient will exhibit full lumbar flexion without pain to reduce limitation at work.    Time  8    Period  Weeks    Status  New    Target Date  12/21/19      PT LONG TERM GOAL #2   Title  Patient will demonstrate lumbar motion WFL without pain to allow for improved movement and lifting ability.    Time  8    Period  Weeks    Status  New    Target Date  12/21/19      PT LONG TERM GOAL #3   Title  Patient will exhibit improve core and hip strenght of > or = 4+/5 to allow for improve lumbar and hip control with lifting.    Time  8    Period  Weeks    Status  New    Target Date  12/21/19      PT LONG TERM GOAL #4   Title  Patient will  report improved functional level < or = 34% FOTO    Time  8    Period  Weeks    Status  New    Target Date  12/21/19            Plan - 12/07/19 1713    Clinical Impression Statement  Patient reports continued left sided low back pain with repeated forward bending or lifting. He does exhibit poor lifting mechanics with increased lumbar/thoracic kyphosis and this could be a result of tight hamstring and poor core/hip control. He is improving overall and he states the sharp left hip pain has gone away. He would benefit from continued skilled PT to improve his lifting mechanics and reduce left sided low back pain.    PT Treatment/Interventions  ADLs/Self Care Home Management;Cryotherapy;Electrical Stimulation;Moist Heat;Traction;Functional mobility training;Therapeutic activities;Therapeutic exercise;Neuromuscular re-education;Patient/family education;Manual techniques;Taping;Dry needling;Passive range of motion;Joint Manipulations;Spinal Manipulations    PT Next Visit Plan  Assess HEP, lifting mechanics and posture, progress core and hip strengthening; assess tolerance to needling    PT Home Exercise Plan  Child's pose stretch, side lying thoracic-lumbar open book stretch, seated hamstring stretch, bridge, side plank on knees (can also keep doing clamshell with red), bird dog hold, SLR, row with blue    Consulted and Agree with Plan of Care  Patient       Patient will benefit from skilled therapeutic intervention in order to improve the following deficits and impairments:  Pain, Decreased strength, Decreased activity tolerance, Improper body mechanics, Decreased range of motion  Visit Diagnosis: Chronic left-sided low back pain, unspecified whether sciatica present  Pain in left hip  Muscle weakness (generalized)     Problem List Patient Active Problem List   Diagnosis Date Noted  . Low back pain 10/11/2019  . Encounter to establish care  10/20/2017  . Need for immunization against  influenza 10/20/2017  . [name] 07/20/2013    Hilda Blades, PT, DPT, LAT, ATC 12/08/19  8:00 AM Phone: [phone] Fax: [facility] [address] [place], [place], [address] Phone: [phone]   Fax:  [phone]  Name: Richard Marks MRN: [id number] Date of Birth: Jul 19, 1986

## 2019-12-14 ENCOUNTER — Encounter: Payer: Self-pay | Admitting: Physical Therapy

## 2019-12-14 ENCOUNTER — Ambulatory Visit: Payer: PPO | Admitting: Physical Therapy

## 2019-12-14 ENCOUNTER — Other Ambulatory Visit: Payer: Self-pay

## 2019-12-14 DIAGNOSIS — M6281 Muscle weakness (generalized): Secondary | ICD-10-CM

## 2019-12-14 DIAGNOSIS — M25552 Pain in left hip: Secondary | ICD-10-CM

## 2019-12-14 DIAGNOSIS — M545 Low back pain, unspecified: Secondary | ICD-10-CM

## 2019-12-14 DIAGNOSIS — G8929 Other chronic pain: Secondary | ICD-10-CM

## 2019-12-14 NOTE — Therapy (Addendum)
[place], [place], [address] Phone: [phone]   Fax:  [phone]  Physical Therapy Treatment / Discharge  Patient Details  Name: Richard Marks MRN: [id number] Date of Birth: 1986-10-25 Referring Provider (PT): Marybelle Killings, MD   Encounter Date: 12/14/2019  PT End of Session - 12/14/19 1700    Visit Number  7    Number of Visits  8    Date for PT Re-Evaluation  12/22/19    Authorization Type  [employer]    PT Start Time  1655    PT Stop Time  1735    PT Time Calculation (min)  40 min    Activity Tolerance  Patient tolerated treatment well    Behavior During Therapy  [facility] for tasks assessed/performed       Past Medical History:  Diagnosis Date  . [name]    Diagnosed at [facility] when patient was 34 years old    Past Surgical History:  Procedure Laterality Date  . NO PAST SURGERIES      There were no vitals filed for this visit.  Subjective Assessment - 12/14/19 1658    Subjective  Patient reports his low back has been more sore this past week. His pain typically comes and goes but this past week it has been more constant. It is mainly just across the lower back and not one side more than the other.    Currently in Pain?  Yes    Pain Score  7     Pain Location  Back    Pain Orientation  Left    Pain Descriptors / Indicators  Sore    Pain Type  Chronic pain    Pain Onset  More than a month ago    Pain Frequency  Intermittent                       [facility] Adult PT Treatment/Exercise - 12/14/19 0001      Exercises   Exercises  Lumbar      Lumbar Exercises: Stretches   Passive Hamstring Stretch  3 reps;30 seconds    Passive Hamstring Stretch Limitations  supine      Lumbar Exercises: Aerobic   Nustep  L6 x 5 min UE/LE      Lumbar Exercises: Standing   Other Standing Lumbar Exercises  Dead lift from 8" step using 25# 3x10, max verbal and  tactile cues for proper form and posture    Other Standing Lumbar Exercises  Pallof press with blue band 5x5 sec x2 each      Lumbar Exercises: Supine   Bridge  --    Bridge Limitations  x10 with 5 sec hold, bolster under legs x10 with 5 sec hold   patient reported less lower back pain with bolster     Lumbar Exercises: Prone   Straight Leg Raise  10 reps;2 seconds   2 sets   Straight Leg Raises Limitations  prone hip extension      Manual Therapy   Manual Therapy  Muscle Energy Technique    Muscle Energy Technique  Right hamstring, left hip flexion 5x10 sec             PT Education - 12/14/19 1700    Education Details  HEP    Person(s) Educated  Patient    Methods  Explanation;Demonstration;Verbal cues    Comprehension  Verbalized understanding;Returned demonstration;Verbal cues required;Need further  instruction       PT Short Term Goals - 11/30/19 0859      PT SHORT TERM GOAL #1   Title  Patient will be I with HEP to maintain progress in PT.    Time  4    Period  Weeks    Status  On-going    Target Date  11/23/19      PT SHORT TERM GOAL #2   Title  Patient will understand and demonstrate proper lifting mechanics to reduce pain at work.    Time  4    Period  Weeks    Status  On-going    Target Date  11/23/19        PT Long Term Goals - 10/27/19 1101      PT LONG TERM GOAL #1   Title  Patient will exhibit full lumbar flexion without pain to reduce limitation at work.    Time  8    Period  Weeks    Status  New    Target Date  12/21/19      PT LONG TERM GOAL #2   Title  Patient will demonstrate lumbar motion WFL without pain to allow for improved movement and lifting ability.    Time  8    Period  Weeks    Status  New    Target Date  12/21/19      PT LONG TERM GOAL #3   Title  Patient will exhibit improve core and hip strenght of > or = 4+/5 to allow for improve lumbar and hip control with lifting.    Time  8    Period  Weeks    Status  New     Target Date  12/21/19      PT LONG TERM GOAL #4   Title  Patient will report improved functional level < or = 34% FOTO    Time  8    Period  Weeks    Status  New    Target Date  12/21/19            Plan - 12/14/19 1701    Clinical Impression Statement  Patient reports continued lower back pain with bending forward and lifting but he continues to exhibit poor form with lifting technique and utilizes his lower back rather than gluts to lift. He is still performing a lot of posterior chain strengthening and he would benefit from hip extension strengthening to reduce stress placed on low back with lifting.    PT Treatment/Interventions  ADLs/Self Care Home Management;Cryotherapy;Electrical Stimulation;Moist Heat;Traction;Functional mobility training;Therapeutic activities;Therapeutic exercise;Neuromuscular re-education;Patient/family education;Manual techniques;Taping;Dry needling;Passive range of motion;Joint Manipulations;Spinal Manipulations    PT Next Visit Plan  Reassessment next visit (12/20/2019), lifting mechanics and posture, dry needling PRN, prone stability strengthening, hip extension strengthening    PT Home Exercise Plan  Child's pose stretch, side lying thoracic-lumbar open book stretch, seated hamstring stretch, bridge, side plank on knees (can also keep doing clamshell with red), bird dog hold, SLR, row with blue    Consulted and Agree with Plan of Care  Patient       Patient will benefit from skilled therapeutic intervention in order to improve the following deficits and impairments:  Pain, Decreased strength, Decreased activity tolerance, Improper body mechanics, Decreased range of motion  Visit Diagnosis: Chronic left-sided low back pain, unspecified whether sciatica present  Pain in left hip  Muscle weakness (generalized)     Problem List Patient Active Problem List  Diagnosis Date Noted  . Low back pain 10/11/2019  . Encounter to establish care 10/20/2017  .  Need for immunization against influenza 10/20/2017  . 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  Patient goals were not met. Patient is being discharged due to not returning since the last visit.  ?????     Hilda Blades, PT, DPT, LAT, ATC 01/30/20  3:09 PM Phone: [phone] Fax: [phone]

## 2019-12-20 ENCOUNTER — Ambulatory Visit: Payer: PPO | Admitting: Physical Therapy

## 2020-06-05 ENCOUNTER — Encounter: Payer: Self-pay | Admitting: Orthopaedic Surgery

## 2020-06-05 ENCOUNTER — Ambulatory Visit (INDEPENDENT_AMBULATORY_CARE_PROVIDER_SITE_OTHER): Payer: PPO | Admitting: Orthopaedic Surgery

## 2020-06-05 VITALS — BP 124/75 | HR 67 | Ht 71.0 in | Wt 182.0 lb

## 2020-06-05 DIAGNOSIS — M545 Low back pain, unspecified: Secondary | ICD-10-CM

## 2020-06-05 NOTE — Progress Notes (Signed)
Office Visit Note   Patient: Richard Marks           Date of Birth: Oct 14, 1986           MRN: [id number] Visit Date: 06/05/2020              Requested by: Waldon Merl, PA-C [address] [place],  [place] [address] PCP: Waldon Merl, PA-C   Assessment & Plan: Visit Diagnoses:  1. 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  We will proceed with MRI scan of the lumbar spine also follow-up after scan for review.  I congratulated him on his 30 pound weight loss he states he feels stronger, lighter but still is having persistent back and buttocks and hip pain that bothers him on a daily basis.  Follow-Up Instructions: Office follow-up after lumbar MRI scan.  Orders:  No orders of the defined types were placed in this encounter.  No orders of the defined types were placed in this encounter.     Procedures: No procedures performed   Clinical Data: No additional findings.   Subjective: Chief Complaint  Patient presents with  . Lower Back - Pain    HPI 34 year old male returns with diagnosis of [name] with ongoing problems with low back pain.  He was seen by me in November and December and therapy and weight loss was recommended.  He has lost 30 pounds has been through therapy as instructed and is used anti-inflammatories without relief with persistent back pain worse on the left than right that radiates into his buttocks sometimes into his legs and bothers him with turning twisting and bending.  Patient is here with his mother.  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 Review of Systems 14 point system update unchanged from 10/11/2019 office visit.   Objective: Vital  Signs: BP 124/75   Pulse 67   Ht 5\' 11"  (1.803 m)   Wt 182 lb (82.6 kg)   BMI 25.38 kg/m   Physical Exam Constitutional:      Appearance: He is well-developed.  HENT:     Head: Normocephalic and atraumatic.  Eyes:     Pupils: Pupils are equal, round, and reactive to light.  Neck:     Thyroid: No thyromegaly.     Trachea: No tracheal deviation.  Cardiovascular:     Rate and Rhythm: Normal rate.  Pulmonary:     Effort: Pulmonary effort is normal.     Breath sounds: No wheezing.  Abdominal:     General: Bowel sounds are normal.     Palpations: Abdomen is soft.  Skin:    General: Skin is warm and dry.     Capillary Refill: Capillary refill takes less than 2 seconds.  Neurological:     Mental Status: He is alert and oriented to person, place, and time.  Psychiatric:        Behavior: Behavior normal.        Thought Content: Thought content normal.        Judgment: Judgment normal.     Ortho Exam patient ambulates.  He has new shoes with no inserts.  Discomfort with lumbar rotation bending left greater than right.  There is tenderness right  paralumbar right sciatic notch.  Some pain with straight leg raising 80 degrees on the left negative on the right.  He is able to heel and toe walk.  Reflexes lower extremities remain intact and symmetrical.  Sensation is intact.  Specialty Comments:  No specialty comments available.  Imaging: No results found.   PMFS History: Patient Active Problem List   Diagnosis Date Noted  . Low back pain 10/11/2019  . Encounter to establish care 10/20/2017  . Need for immunization against influenza 10/20/2017  . 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 Diabetes Mother   . Hypertension Mother   . Hyperlipidemia Mother   . Depression Mother   . Hypertension Father   . Diabetes  Father   . COPD Father   . Alcohol abuse Sister   . Asthma Sister   . Depression Sister   . Drug abuse Sister   . Hypertension Sister   . Hypertension Sister   . Depression Sister   . Diabetes Sister   . Depression Brother   . Diabetes Maternal Grandmother   . Heart disease Maternal Grandmother   . Hyperlipidemia Maternal Grandmother   . Kidney disease Maternal Grandmother   . Heart disease Maternal Grandfather   . Hypertension Maternal Grandfather   . Kidney disease Maternal Grandfather   . Arthritis Paternal Grandmother   . Diabetes Paternal Grandmother   . Heart disease Paternal Grandmother   . Hyperlipidemia Paternal Grandmother   . Hypertension Paternal Grandmother   . Kidney disease Paternal Grandmother   . Stroke Paternal Grandmother   . Asthma Paternal Grandfather   . COPD Paternal Grandfather   . Hyperlipidemia Paternal Grandfather   . Hypertension Paternal Grandfather   . Kidney disease Paternal Grandfather     Past Surgical History:  Procedure Laterality Date  . NO PAST SURGERIES     Social History   Occupational History  . Not on file  Tobacco Use  . Smoking status: Never Smoker  . Smokeless tobacco: Never Used  Vaping Use  . Vaping Use: Never used  Substance and Sexual Activity  . Alcohol use: No  . Drug use: No  . Sexual activity: Never

## 2020-06-05 NOTE — Addendum Note (Signed)
Addended by: Rogers Seeds on: 06/05/2020 09:38 AM   Modules accepted: Orders

## 2020-06-29 ENCOUNTER — Ambulatory Visit
Admission: RE | Admit: 2020-06-29 | Discharge: 2020-06-29 | Disposition: A | Discharge status: Home / Self Care | Payer: PPO | Source: Ambulatory Visit | Attending: Orthopaedic Surgery | Admitting: Orthopaedic Surgery

## 2020-06-29 ENCOUNTER — Other Ambulatory Visit: Payer: Self-pay

## 2020-06-29 DIAGNOSIS — M545 Low back pain, unspecified: Secondary | ICD-10-CM

## 2020-06-29 DIAGNOSIS — R600 Localized edema: Secondary | ICD-10-CM | POA: Diagnosis not present

## 2020-06-29 DIAGNOSIS — M5137 Other intervertebral disc degeneration, lumbosacral region: Secondary | ICD-10-CM | POA: Diagnosis not present

## 2020-06-29 DIAGNOSIS — M5127 Other intervertebral disc displacement, lumbosacral region: Secondary | ICD-10-CM | POA: Diagnosis not present

## 2020-07-02 ENCOUNTER — Ambulatory Visit (INDEPENDENT_AMBULATORY_CARE_PROVIDER_SITE_OTHER): Payer: PPO | Admitting: Orthopaedic Surgery

## 2020-07-02 ENCOUNTER — Encounter: Payer: Self-pay | Admitting: Orthopaedic Surgery

## 2020-07-02 VITALS — Ht 71.0 in | Wt 182.0 lb

## 2020-07-02 DIAGNOSIS — M545 Low back pain, unspecified: Secondary | ICD-10-CM

## 2020-07-02 DIAGNOSIS — M5136 Other intervertebral disc degeneration, lumbar region: Secondary | ICD-10-CM | POA: Diagnosis not present

## 2020-07-02 NOTE — Progress Notes (Signed)
Office Visit Note   Patient: Richard Marks           Date of Birth: [birth year]           MRN: [id number] Visit Date: 07/02/2020              Requested by: Waldon Merl, PA-C [address] [place],  [place] [address] PCP: Waldon Merl, PA-C   Assessment & Plan: Visit Diagnoses:  1. Bilateral low back pain, unspecified chronicity, unspecified whether sciatica present   2. Other intervertebral disc degeneration, lumbar region     Plan: MRI scan shows L5-S1 disc degeneration with edema at the endplates particularly on the left side at L5-S1.  No fracture no worrisome changes that would suggest infection.  No stenosis.  Long discussion with patient and his mother about disc degeneration with endplate inflammatory changes and its correlation with back pain symptoms.  Along he is able to tolerate symptoms using topical treatment anti-inflammatories or Tylenol he can continue normal activities.  He understands that his symptoms are likely to improve with time.  I plan to recheck him again in 6 months.  Follow-Up Instructions: Return in about 6 months (around 01/02/2021).   Orders:  No orders of the defined types were placed in this encounter.  No orders of the defined types were placed in this encounter.     Procedures: No procedures performed   Clinical Data: No additional findings.   Subjective: Chief Complaint  Patient presents with  . Lower Back - Pain, Follow-up    MRI Lumbar review    HPI 34 year old male returns with ongoing problems with back pain.  MRI scan has been obtained and is available for review today.  I gave him a copy of the report and we reviewed the images at today's visit.  Patient has a known diagnosis of [name] with ongoing problems with chronic back pain.  Has been through therapy weight loss has been recommended he has lost 30 pounds.  He is using anti-inflammatories without relief.  He continues to have symptoms of turning and  twisting.  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  He is not doing as much lifting states he has less symptoms.  Review of Systems   Objective: Vital Signs: Ht 5\' 11"  (1.803 m)   Wt 182 lb (82.6 kg)   BMI 25.38 kg/m   Physical Exam Constitutional:      Appearance: He is well-developed.  HENT:     Head: Normocephalic and atraumatic.  Eyes:     Pupils: Pupils are equal, round, and reactive to light.  Neck:     Thyroid: No thyromegaly.     Trachea: No tracheal deviation.  Cardiovascular:     Rate and Rhythm: Normal rate.  Pulmonary:     Effort: Pulmonary effort is normal.     Breath sounds: No wheezing.  Abdominal:     General: Bowel sounds are normal.     Palpations: Abdomen is soft.  Skin:    General: Skin is warm and dry.     Capillary Refill: Capillary refill takes less than 2 seconds.  Neurological:     Mental Status: He is alert and oriented to person, place, and time.  Psychiatric:        Behavior: Behavior normal.        Thought Content: Thought content normal.        Judgment: Judgment normal.  Ortho Exam patient has negative straight leg raising.  He has discomfort with turning and twisting.  Some back pain with straight leg raising 90 degrees negative popliteal compression test knee and ankle jerk are intact anterior tib EHL heel toe walking is intact.  Specialty Comments:  No specialty comments available.  Imaging: CLINICAL DATA:  Low back pain for over 6 weeks.  EXAM: MRI LUMBAR SPINE WITHOUT CONTRAST  TECHNIQUE: Multiplanar, multisequence MR imaging of the lumbar spine was performed. No intravenous contrast was administered.  COMPARISON:  Lumbar radiography 09/14/2019  FINDINGS: Segmentation:  5 lumbar type vertebrae  Alignment:  Slight levocurvature which could be positional  Vertebrae: Mix of fatty and edematous discogenic marrow signal alteration on both sides of the L5-S1 disc  space towards the left. No fracture, discitis, or aggressive bone lesion.  Conus medullaris and cauda equina: Conus extends to the L1 level. Conus and cauda equina appear normal.  Paraspinal and other soft tissues: Negative  Disc levels:  L5-S1 left-sided disc desiccation and narrowing with bulge. No neural impingement.  IMPRESSION: L5-S1 disc degeneration with discogenic marrow edema. No neural compression.   Electronically Signed   By: Marnee Spring M.D.   On: 06/29/2020 16:45   PMFS History: Patient Active Problem List   Diagnosis Date Noted  . Other intervertebral disc degeneration, lumbar region 07/08/2020  . Low back pain 10/11/2019  . Encounter to establish care 10/20/2017  . Need for immunization against influenza 10/20/2017  . 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 [name]    Diagnosed at [facility] when patient was 34 years old    Family History  Problem Relation Age of Onset  . Diabetes Mother   . Hypertension Mother   . Hyperlipidemia Mother   . Depression Mother   . Hypertension Father   . Diabetes Father   . COPD Father   . Alcohol abuse Sister   . Asthma Sister   . Depression Sister   . Drug abuse Sister   . Hypertension Sister   . Hypertension Sister   . Depression Sister   . Diabetes Sister   . Depression Brother   . Diabetes Maternal Grandmother   . Heart disease Maternal Grandmother   . Hyperlipidemia Maternal Grandmother   . Kidney disease Maternal Grandmother   . Heart disease Maternal Grandfather   . Hypertension Maternal Grandfather   . Kidney disease Maternal Grandfather   . Arthritis Paternal Grandmother   . Diabetes Paternal Grandmother   . Heart disease Paternal Grandmother   . Hyperlipidemia Paternal Grandmother   . Hypertension Paternal Grandmother   . Kidney disease Paternal Grandmother   . Stroke Paternal Grandmother   . Asthma Paternal Grandfather   . COPD  Paternal Grandfather   . Hyperlipidemia Paternal Grandfather   . Hypertension Paternal Grandfather   . Kidney disease Paternal Grandfather     Past Surgical History:  Procedure Laterality Date  . NO PAST SURGERIES     Social History   Occupational History  . Not on file  Tobacco Use  . Smoking status: Never Smoker  . Smokeless tobacco: Never Used  Vaping Use  . Vaping Use: Never used  Substance and Sexual Activity  . Alcohol use: No  . Drug use: No  . Sexual activity: Never

## 2020-07-08 DIAGNOSIS — M5136 Other intervertebral disc degeneration, lumbar region: Secondary | ICD-10-CM | POA: Insufficient documentation

## 2020-09-18 ENCOUNTER — Other Ambulatory Visit: Payer: PPO

## 2020-09-18 DIAGNOSIS — Z20822 Contact with and (suspected) exposure to covid-19: Secondary | ICD-10-CM | POA: Diagnosis not present

## 2020-09-19 LAB — NOVEL CORONAVIRUS, NAA: SARS-CoV-2, NAA: NOT DETECTED

## 2020-09-19 LAB — SARS-COV-2, NAA 2 DAY TAT

## 2021-01-07 ENCOUNTER — Ambulatory Visit: Payer: PPO | Admitting: Orthopaedic Surgery

## 2021-03-17 ENCOUNTER — Telehealth: Payer: Self-pay

## 2021-03-17 NOTE — Telephone Encounter (Signed)
Unable to LVM advising to do TOC. 

## 2021-07-30 ENCOUNTER — Encounter: Payer: Self-pay | Admitting: Registered Nurse

## 2021-07-30 ENCOUNTER — Telehealth: Payer: Self-pay | Admitting: Registered Nurse

## 2021-07-30 ENCOUNTER — Other Ambulatory Visit: Payer: Self-pay

## 2021-07-30 ENCOUNTER — Ambulatory Visit (INDEPENDENT_AMBULATORY_CARE_PROVIDER_SITE_OTHER): Payer: PPO | Admitting: Registered Nurse

## 2021-07-30 VITALS — BP 119/82 | HR 65 | Temp 98.3°F | Ht 71.0 in | Wt 212.4 lb

## 2021-07-30 DIAGNOSIS — Z13228 Encounter for screening for other metabolic disorders: Secondary | ICD-10-CM | POA: Diagnosis not present

## 2021-07-30 DIAGNOSIS — Z0000 Encounter for general adult medical examination without abnormal findings: Secondary | ICD-10-CM | POA: Diagnosis not present

## 2021-07-30 DIAGNOSIS — M25352 Other instability, left hip: Secondary | ICD-10-CM | POA: Diagnosis not present

## 2021-07-30 DIAGNOSIS — Z130 Encounter for screening for diseases of the blood and blood-forming organs and certain disorders involving the immune mechanism: Secondary | ICD-10-CM | POA: Diagnosis not present

## 2021-07-30 DIAGNOSIS — M545 Low back pain, unspecified: Secondary | ICD-10-CM

## 2021-07-30 DIAGNOSIS — G8929 Other chronic pain: Secondary | ICD-10-CM | POA: Diagnosis not present

## 2021-07-30 DIAGNOSIS — Z1329 Encounter for screening for other suspected endocrine disorder: Secondary | ICD-10-CM | POA: Diagnosis not present

## 2021-07-30 DIAGNOSIS — Z23 Encounter for immunization: Secondary | ICD-10-CM

## 2021-07-30 DIAGNOSIS — Z13220 Encounter for screening for lipoid disorders: Secondary | ICD-10-CM

## 2021-07-30 DIAGNOSIS — Z1159 Encounter for screening for other viral diseases: Secondary | ICD-10-CM

## 2021-07-30 LAB — COMPREHENSIVE METABOLIC PANEL
ALT: 15 U/L (ref 0–53)
AST: 17 U/L (ref 0–37)
Albumin: 4.6 g/dL (ref 3.5–5.2)
Alkaline Phosphatase: 51 U/L (ref 39–117)
BUN: 16 mg/dL (ref 6–23)
CO2: 27 mEq/L (ref 19–32)
Calcium: 9.2 mg/dL (ref 8.4–10.5)
Chloride: 103 mEq/L (ref 96–112)
Creatinine, Ser: 0.9 mg/dL (ref 0.40–1.50)
GFR: 110.98 mL/min (ref >60.00)
Glucose, Bld: 80 mg/dL (ref 70–99)
Potassium: 4 mEq/L (ref 3.5–5.1)
Sodium: 138 mEq/L (ref 135–145)
Total Bilirubin: 0.4 mg/dL (ref 0.2–1.2)
Total Protein: 7.2 g/dL (ref 6.0–8.3)

## 2021-07-30 LAB — CBC WITH DIFFERENTIAL/PLATELET
Basophils Absolute: 0 10*3/uL (ref 0.0–0.1)
Basophils Relative: 0.7 % (ref 0.0–3.0)
Eosinophils Absolute: 0.1 10*3/uL (ref 0.0–0.7)
Eosinophils Relative: 1.3 % (ref 0.0–5.0)
HCT: 45.7 % (ref 39.0–52.0)
Hemoglobin: 15.6 g/dL (ref 13.0–17.0)
Lymphocytes Relative: 29.1 % (ref 12.0–46.0)
Lymphs Abs: 1.4 10*3/uL (ref 0.7–4.0)
MCHC: 34.1 g/dL (ref 30.0–36.0)
MCV: 96.9 fl (ref 78.0–100.0)
Monocytes Absolute: 0.3 10*3/uL (ref 0.1–1.0)
Monocytes Relative: 5.4 % (ref 3.0–12.0)
Neutro Abs: 3.1 10*3/uL (ref 1.4–7.7)
Neutrophils Relative %: 63.5 % (ref 43.0–77.0)
Platelets: 197 10*3/uL (ref 150.0–400.0)
RBC: 4.71 Mil/uL (ref 4.22–5.81)
RDW: 12.8 % (ref 11.5–15.5)
WBC: 4.8 10*3/uL (ref 4.0–10.5)

## 2021-07-30 LAB — LIPID PANEL
Cholesterol: 186 mg/dL (ref 0–200)
HDL: 30.1 mg/dL — ABNORMAL LOW (ref >39.00)
LDL Cholesterol: 117 mg/dL — ABNORMAL HIGH (ref 0–99)
NonHDL: 156.14
Total CHOL/HDL Ratio: 6
Triglycerides: 197 mg/dL — ABNORMAL HIGH (ref 0.0–149.0)
VLDL: 39.4 mg/dL (ref 0.0–40.0)

## 2021-07-30 LAB — TSH: TSH: 2.43 u[IU]/mL (ref 0.35–5.50)

## 2021-07-30 LAB — HEMOGLOBIN A1C: Hgb A1c MFr Bld: 5.3 % (ref 4.6–6.5)

## 2021-07-30 MED ORDER — CYCLOBENZAPRINE HCL 5 MG PO TABS: 5.0000 mg | ORAL_TABLET | Freq: Two times a day (BID) | ORAL | 1 refills | Status: DC | PRN

## 2021-07-30 MED ORDER — DICLOFENAC SODIUM 75 MG PO TBEC: 75.0000 mg | DELAYED_RELEASE_TABLET | Freq: Two times a day (BID) | ORAL | 1 refills | Status: DC

## 2021-07-30 MED ORDER — MELOXICAM 15 MG PO TABS: 15.0000 mg | ORAL_TABLET | Freq: Every day | ORAL | 0 refills | Status: DC

## 2021-07-30 NOTE — Telephone Encounter (Signed)
Pt needs the meloxicam sent into the [facility] on [address]. Was seen today by [name] for a TOC.

## 2021-07-30 NOTE — Patient Instructions (Signed)
[name] -   Richard Marks to meet you  Labs today will be back this afternoon. I'll call with any concerns.  Let's try Diclofenac 75mg  twice daily as needed for pain. This is an NSAID, so do not take other NSAIDs with this.  Let's also try Flexeril 5mg  once or twice daily as needed. This is a muscle relaxer - it may make you a little sleepy. I want you to take one every night for the next week at least.  I will order Xrays for the lower back and hips. These will happen at [facility] at [address] Usually they can take walk-ins.  Other than above, no concerns on exam today. See you in a year for your next physical.  Thank you  [name]

## 2021-07-30 NOTE — Progress Notes (Signed)
Established Patient Office Visit  Subjective:  Patient ID: Richard Marks, male    DOB: 10-10-1986  Age: 35 y.o. MRN: [id number]  CC:  Chief Complaint  Patient presents with   Transitions Of Care    Patient states he is here for a TOC. Patient states he has been having back pain for A year had a MRI done and its getting.    HPI Richard Marks presents for CPE and back pain  Histories reviewed and updated with patient.   Back pain Ongoing 2+ years Waxes and wanes Improved somewhat with weight loss in past - at one time down to 182 lbs - but has never truly resolved Has gone through PT and work up with Dr. Ophelia Charter in ortho. MRI showing some degenerative changes but overall not any acute concerns. Pain today as it has been previously. No saddle symptoms or radicular symptoms. He has gained weight since last visit- at 212lbs today. No acute injury or trauma Notes that he does work a very physically strenuous job.  HM Interested in flu shot today. Interested in routine HIV and Hep C screening today.  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35 years old    Past Surgical History:  Procedure Laterality Date   NO PAST SURGERIES      Family History  Problem Relation Age of Onset   Diabetes Mother    Hypertension Mother    Hyperlipidemia Mother    Depression Mother    Hypertension Father    Diabetes Father    COPD Father    Alcohol abuse Sister    Asthma Sister    Depression Sister    Drug abuse Sister    Hypertension Sister    Hypertension Sister    Depression Sister    Diabetes Sister    Depression Brother    Diabetes Maternal Grandmother    Heart disease Maternal Grandmother    Hyperlipidemia Maternal Grandmother    Kidney disease Maternal Grandmother    Heart disease Maternal Grandfather    Hypertension Maternal Grandfather    Kidney disease Maternal Grandfather    Arthritis Paternal  Grandmother    Diabetes Paternal Grandmother    Heart disease Paternal Grandmother    Hyperlipidemia Paternal Grandmother    Hypertension Paternal Grandmother    Kidney disease Paternal Grandmother    Stroke Paternal Grandmother    Asthma Paternal Grandfather    COPD Paternal Grandfather    Hyperlipidemia Paternal Grandfather    Hypertension Paternal Grandfather    Kidney disease Paternal Grandfather     Social History   Socioeconomic History   Marital status: Single    Spouse name: Not on file   Number of children: Not on file   Years of education: Not on file   Highest education level: Not on file  Occupational History   Not on file  Tobacco Use   Smoking status: Never   Smokeless tobacco: Never  Vaping Use   Vaping Use: Never used  Substance and Sexual Activity   Alcohol use: No   Drug use: No   Sexual activity: Never  Other Topics Concern   Not on file  Social History Narrative   Learning disability   Non smoker   Occasional drinker   High school graduate   Lives at home with mother and 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 (Patient not taking: No sig reported) 30 tablet 0   No facility-administered medications prior to visit.    No Known Allergies  ROS Review of Systems  Constitutional: Negative.   HENT: Negative.    Eyes: Negative.   Respiratory: Negative.    Cardiovascular: Negative.   Gastrointestinal: Negative.   Genitourinary: Negative.   Musculoskeletal: Negative.   Skin: Negative.   Neurological: Negative.    Psychiatric/Behavioral: Negative.       Objective:    Physical Exam Vitals and nursing note reviewed.  Constitutional:      General: He is not in acute distress.    Appearance: Normal appearance. He is normal weight. He is not ill-appearing, toxic-appearing or diaphoretic.  HENT:     Head: Normocephalic and atraumatic.     Right Ear: Tympanic membrane, ear canal and external ear normal. There is no impacted cerumen.     Left Ear: Tympanic membrane, ear canal and external ear normal. There is no impacted cerumen.     Nose: Nose normal. No congestion or rhinorrhea.     Mouth/Throat:     Mouth: Mucous membranes are moist.     Pharynx: Oropharynx is clear. No oropharyngeal exudate or posterior oropharyngeal erythema.  Eyes:     General: No scleral icterus.       Right eye: No discharge.        Left eye: No discharge.     Extraocular Movements: Extraocular movements intact.     Conjunctiva/sclera: Conjunctivae normal.     Pupils: Pupils are equal, round, and reactive to light.  Neck:     Vascular: No carotid bruit.  Cardiovascular:     Rate and Rhythm: Normal rate and regular rhythm.     Pulses: Normal pulses.     Heart sounds: Normal heart sounds. No murmur heard.   No friction rub. No gallop.  Pulmonary:     Effort: Pulmonary effort is normal. No respiratory distress.     Breath sounds: Normal breath sounds. No stridor. No wheezing, rhonchi or rales.  Chest:     Chest wall: No tenderness.  Abdominal:     General: Abdomen is flat. Bowel sounds are normal. There is no distension.     Palpations: Abdomen is soft. There is no mass.     Tenderness: There is no abdominal tenderness. There is no right CVA tenderness, left CVA tenderness, guarding or rebound.     Hernia: No hernia is present.  Musculoskeletal:        General: Tenderness (mild paraspinal in lumbar) present. No swelling, deformity or signs of injury. Normal range of motion.     Cervical back: Normal range of motion  and neck supple. No rigidity or tenderness.     Right lower leg: No edema.     Left lower leg: No edema.  Lymphadenopathy:     Cervical: No cervical adenopathy.  Skin:    General: Skin is warm and dry.     Capillary Refill: Capillary refill takes less than 2 seconds.     Coloration: Skin  is not jaundiced or pale.     Findings: No bruising, erythema, lesion or rash.  Neurological:     General: No focal deficit present.     Mental Status: He is alert and oriented to person, place, and time. Mental status is at baseline.     Cranial Nerves: No cranial nerve deficit.     Motor: No weakness.     Gait: Gait normal.  Psychiatric:        Mood and Affect: Mood normal.        Behavior: Behavior normal.        Thought Content: Thought content normal.        Judgment: Judgment normal.    BP 119/82   Pulse 65   Temp 98.3 F (36.8 C) (Temporal)   Ht 5\' 11"  (1.803 m)   Wt 212 lb 6.4 oz (96.3 kg)   BMI 29.62 kg/m  Wt Readings from Last 3 Encounters:  07/30/21 212 lb 6.4 oz (96.3 kg)  07/02/20 182 lb (82.6 kg)  06/05/20 182 lb (82.6 kg)     Health Maintenance Due  Topic Date Due   COVID-19 Vaccine (1) Never done   HIV Screening  Never done   Hepatitis C Screening  Never done   INFLUENZA VACCINE  06/16/2021    There are no preventive care reminders to display for this patient.  Lab Results  Component Value Date   TSH 1.39 10/20/2017   Lab Results  Component Value Date   WBC 6.2 10/20/2017   HGB 15.9 10/20/2017   HCT 46.7 10/20/2017   MCV 97.9 10/20/2017   PLT 222.0 10/20/2017   Lab Results  Component Value Date   NA 140 10/20/2017   K 4.0 10/20/2017   CO2 27 10/20/2017   GLUCOSE 112 (H) 10/20/2017   BUN 13 10/20/2017   CREATININE 0.86 10/20/2017   BILITOT 0.3 10/20/2017   ALKPHOS 52 10/20/2017   AST 22 10/20/2017   ALT 28 10/20/2017   PROT 7.1 10/20/2017   ALBUMIN 4.8 10/20/2017   CALCIUM 9.1 10/20/2017   ANIONGAP 7 05/11/2016   GFR 110.02 10/20/2017   Lab  Results  Component Value Date   CHOL 173 10/20/2017   Lab Results  Component Value Date   HDL 30.70 (L) 10/20/2017   No results found for: [facility] Lab Results  Component Value Date   TRIG 213.0 (H) 10/20/2017   Lab Results  Component Value Date   CHOLHDL 6 10/20/2017   Lab Results  Component Value Date   HGBA1C 5.2 10/21/2017      Assessment & Plan:   Problem List Items Addressed This Visit       Other   Low back pain   Relevant Medications   cyclobenzaprine (FLEXERIL) 5 MG tablet   diclofenac (VOLTAREN) 75 MG EC tablet   Other Relevant Orders   DG Lumbar Spine Complete   Other Visit Diagnoses     Annual physical exam    -  Primary   Screening for endocrine, metabolic and immunity disorder       Relevant Orders   CBC with Differential/Platelet   Comprehensive metabolic panel   Hemoglobin A1c   TSH   Lipid screening       Relevant Orders   Lipid panel   Screening for viral disease       Relevant Orders   HIV Antibody (routine testing w rflx)   Hepatitis C Antibody   Flu vaccine need  Relevant Orders   Flu Vaccine QUAD 6+ mos PF IM (Fluarix Quad PF)   Instability of left hip joint       Relevant Medications   cyclobenzaprine (FLEXERIL) 5 MG tablet   diclofenac (VOLTAREN) 75 MG EC tablet   Other Relevant Orders   DG Hip Unilat W OR W/O Pelvis 2-3 Views Right   DG Hip Unilat W OR W/O Pelvis 2-3 Views Left       Meds ordered this encounter  Medications   cyclobenzaprine (FLEXERIL) 5 MG tablet    Sig: Take 1 tablet (5 mg total) by mouth 2 (two) times daily as needed for muscle spasms.    Dispense:  30 tablet    Refill:  1    Order Specific Question:   Supervising Provider    Answer:   Neva Seat, JEFFREY R [[id number]]   diclofenac (VOLTAREN) 75 MG EC tablet    Sig: Take 1 tablet (75 mg total) by mouth 2 (two) times daily.    Dispense:  30 tablet    Refill:  1    Order Specific Question:   Supervising Provider    Answer:   Neva Seat, JEFFREY R [[id number]]     Follow-up: Return in about 1 year (around 07/30/2022) for CPE and labs.   PLAN Lower back pain, hip instability - sending diclofenac and flexeril. Discussed risks and alternatives with patient. Will order DG lumbar spine and Dg hips to check on degenerative changes. Consider consult with Dr. Ophelia Charter depending on results and response to medication. Labs collected. Will follow up with the patient as warranted. Other than back pain exam unremarkable Patient encouraged to call clinic with any questions, comments, or concerns.  Janeece Agee, NP

## 2021-07-30 NOTE — Telephone Encounter (Signed)
Rx filled and sent to patient pharmacy 

## 2021-07-31 ENCOUNTER — Other Ambulatory Visit: Payer: Self-pay | Admitting: Registered Nurse

## 2021-07-31 ENCOUNTER — Ambulatory Visit
Admission: RE | Admit: 2021-07-31 | Discharge: 2021-07-31 | Disposition: A | Discharge status: Home / Self Care | Payer: PPO | Source: Ambulatory Visit | Attending: Registered Nurse | Admitting: Registered Nurse

## 2021-07-31 DIAGNOSIS — M25352 Other instability, left hip: Secondary | ICD-10-CM

## 2021-07-31 DIAGNOSIS — M25552 Pain in left hip: Secondary | ICD-10-CM | POA: Diagnosis not present

## 2021-07-31 DIAGNOSIS — M25551 Pain in right hip: Secondary | ICD-10-CM | POA: Diagnosis not present

## 2021-07-31 DIAGNOSIS — M545 Low back pain, unspecified: Secondary | ICD-10-CM | POA: Diagnosis not present

## 2021-07-31 DIAGNOSIS — G8929 Other chronic pain: Secondary | ICD-10-CM

## 2021-07-31 LAB — HEPATITIS C ANTIBODY
Hepatitis C Ab: NONREACTIVE
SIGNAL TO CUT-OFF: 0.04 (ref <1.00)

## 2021-11-26 ENCOUNTER — Telehealth: Payer: Self-pay | Admitting: Orthopaedic Surgery

## 2021-11-26 NOTE — Telephone Encounter (Signed)
Is this ok?

## 2021-11-26 NOTE — Telephone Encounter (Signed)
Pt's mother Ginger called requesting pt see Dr. Erlinda Hong. Please call and make an appt if pt is allowed to make an appt with Dr. Erlinda Hong Phone number is [phone].

## 2021-11-26 NOTE — Telephone Encounter (Signed)
Pt mother decide with pt to see Dr. Ophelia Charter

## 2021-11-27 NOTE — Telephone Encounter (Signed)
Please schedule

## 2021-12-16 ENCOUNTER — Ambulatory Visit (INDEPENDENT_AMBULATORY_CARE_PROVIDER_SITE_OTHER): Payer: PPO | Admitting: Orthopaedic Surgery

## 2021-12-16 ENCOUNTER — Other Ambulatory Visit: Payer: Self-pay

## 2021-12-16 ENCOUNTER — Encounter: Payer: Self-pay | Admitting: Orthopaedic Surgery

## 2021-12-16 VITALS — BP 126/85 | HR 77 | Ht 71.0 in | Wt 204.4 lb

## 2021-12-16 DIAGNOSIS — M7062 Trochanteric bursitis, left hip: Secondary | ICD-10-CM | POA: Diagnosis not present

## 2021-12-16 DIAGNOSIS — M5136 Other intervertebral disc degeneration, lumbar region: Secondary | ICD-10-CM | POA: Diagnosis not present

## 2021-12-16 MED ORDER — LIDOCAINE HCL 1 % IJ SOLN
1.0000 mL | INTRAMUSCULAR | Status: AC | PRN
  Administered 2021-12-16: 1 mL

## 2021-12-16 MED ORDER — BUPIVACAINE HCL 0.25 % IJ SOLN
2.0000 mL | INTRAMUSCULAR | Status: AC | PRN
Start: 2021-12-16 — End: 2021-12-16
  Administered 2021-12-16: 2 mL via INTRA_ARTICULAR

## 2021-12-16 MED ORDER — METHYLPREDNISOLONE ACETATE 40 MG/ML IJ SUSP
40.0000 mg | INTRAMUSCULAR | Status: AC | PRN
  Administered 2021-12-16: 40 mg via INTRA_ARTICULAR

## 2021-12-16 NOTE — Progress Notes (Signed)
Office Visit Note   Patient: Richard Marks           Date of Birth: 1986/07/05           MRN: [id number] Visit Date: 12/16/2021              Requested by: Janeece Agee, NP [address] [place],  [place] [address] PCP: Janeece Agee, NP   Assessment & Plan: Visit Diagnoses:  1. Other intervertebral disc degeneration, lumbar region   2. Trochanteric bursitis, left hip     Plan: We will need repeat MRI with increasing symptoms with back pain left leg pain.  He did have some disc bulge and significant discogenic endplate changes at L5-S1 single level only.  We will obtain a new lumbar MRI for comparison and then consider further treatment and possible epidural injection.  We injected his trochanter on the left which she tolerated well hopefully this will give him some pain relief.  Follow-up after new MRI.  Mother is with him today and states she has been miserable is having great trouble walking, can help his dad around the house etc.  Follow-Up Instructions: No follow-ups on file.   Orders:  Orders Placed This Encounter  Procedures   Large Joint Inj: L greater trochanter   MR Lumbar Spine w/o contrast   No orders of the defined types were placed in this encounter.     Procedures: Large Joint Inj: L greater trochanter on 12/16/2021 10:19 AM Details: lateral approach Medications: 1 mL lidocaine 1 %; 2 mL bupivacaine 0.25 %; 40 mg methylPREDNISolone acetate 40 MG/ML     Clinical Data: No additional findings.   Subjective: Chief Complaint  Patient presents with   Left Hip - Follow-up   Lower Back - Follow-up    HPI 36 year old male seen with hip pain on the left he states is recently been worse.  Previous MRI scan showed discogenic edema L5-S1 with maintenance of disc space height trace bulge.  All other levels in the lumbar spine were normal.  He took Flexeril and diclofenac that helped but then he started having stomach problems.  He went to therapy without  relief.  Has not had an injection.  He ambulates with a short stride gait swinging his arms almost march type like a soldier.  His mother is with him and he states he has been worse in the last month.  Pain has been worse radiating into the buttocks left leg left thigh sometimes down to his left foot.  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  Patient is not a dwarf if no brachial index bili no skeletal dysplasia.  Normal height.  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    Review of Systems all other systems noncontributory.   Objective: Vital Signs: BP 126/85    Pulse 77    Ht 5\' 11"  (1.803 m)    Wt 204 lb 6.4 oz (92.7 kg)    BMI 28.51 kg/m   Physical Exam Constitutional:      Appearance: He is well-developed.  HENT:     Head: Normocephalic and atraumatic.     Right Ear: External ear normal.     Left Ear: External ear normal.  Eyes:     Pupils: Pupils are equal, round, and reactive to light.  Neck:     Thyroid: No thyromegaly.     Trachea:  No tracheal deviation.  Cardiovascular:     Rate and Rhythm: Normal rate.  Pulmonary:     Effort: Pulmonary effort is normal.     Breath sounds: No wheezing.  Abdominal:     General: Bowel sounds are normal.     Palpations: Abdomen is soft.  Musculoskeletal:     Cervical back: Neck supple.  Skin:    General: Skin is warm and dry.     Capillary Refill: Capillary refill takes less than 2 seconds.  Neurological:     Mental Status: He is alert and oriented to person, place, and time.  Psychiatric:        Behavior: Behavior normal.        Thought Content: Thought content normal.        Judgment: Judgment normal.    Ortho Exam negative logroll of the hips no hip flexion contracture anterior tib gastrocsoleus is strong.  Exquisite tenderness over the left trochanter.  Pain with straight leg raising on the left at 80 degrees negative on the  right.  Specialty Comments:  No specialty comments available.  Imaging: No results found.   PMFS History: Patient Active Problem List   Diagnosis Date Noted   Other intervertebral disc degeneration, lumbar region 07/08/2020   Low back pain 10/11/2019   Encounter to establish care 10/20/2017   Need for immunization against influenza 10/20/2017   [name] 07/20/2013   Past Medical History:  Diagnosis Date   [name]    Diagnosed at [facility] when patient was 36 years old    Family History  Problem Relation Age of Onset   Diabetes Mother    Hypertension Mother    Hyperlipidemia Mother    Depression Mother    Hypertension Father    Diabetes Father    COPD Father    Alcohol abuse Sister    Asthma Sister    Depression Sister    Drug abuse Sister    Hypertension Sister    Hypertension Sister    Depression Sister    Diabetes Sister    Depression Brother    Diabetes Maternal Grandmother    Heart disease Maternal Grandmother    Hyperlipidemia Maternal Grandmother    Kidney disease Maternal Grandmother    Heart disease Maternal Grandfather    Hypertension Maternal Grandfather    Kidney disease Maternal Grandfather    Arthritis Paternal Grandmother    Diabetes Paternal Grandmother    Heart disease Paternal Grandmother    Hyperlipidemia Paternal Grandmother    Hypertension Paternal Grandmother    Kidney disease Paternal Grandmother    Stroke Paternal Grandmother    Asthma Paternal Grandfather    COPD Paternal Grandfather    Hyperlipidemia Paternal Grandfather    Hypertension Paternal Grandfather    Kidney disease Paternal Grandfather     Past Surgical History:  Procedure Laterality Date   NO PAST SURGERIES     Social History   Occupational History   Not on file  Tobacco Use   Smoking status: Never   Smokeless tobacco: Never  Vaping Use   Vaping Use: Never used  Substance and Sexual Activity   Alcohol use: No   Drug use: No   Sexual  activity: Never

## 2022-01-08 ENCOUNTER — Ambulatory Visit
Admission: RE | Admit: 2022-01-08 | Discharge: 2022-01-08 | Disposition: A | Discharge status: Home / Self Care | Payer: PPO | Source: Ambulatory Visit | Attending: Orthopaedic Surgery | Admitting: Orthopaedic Surgery

## 2022-01-08 ENCOUNTER — Other Ambulatory Visit: Payer: Self-pay

## 2022-01-08 DIAGNOSIS — M25552 Pain in left hip: Secondary | ICD-10-CM | POA: Diagnosis not present

## 2022-01-08 DIAGNOSIS — M545 Low back pain, unspecified: Secondary | ICD-10-CM | POA: Diagnosis not present

## 2022-01-08 DIAGNOSIS — M5136 Other intervertebral disc degeneration, lumbar region: Secondary | ICD-10-CM

## 2022-02-06 ENCOUNTER — Encounter: Payer: Self-pay | Admitting: Orthopaedic Surgery

## 2022-02-06 ENCOUNTER — Other Ambulatory Visit: Payer: Self-pay

## 2022-02-06 ENCOUNTER — Ambulatory Visit (INDEPENDENT_AMBULATORY_CARE_PROVIDER_SITE_OTHER): Payer: PPO | Admitting: Orthopaedic Surgery

## 2022-02-06 VITALS — BP 127/87 | Ht 71.0 in | Wt 204.0 lb

## 2022-02-06 DIAGNOSIS — M5136 Other intervertebral disc degeneration, lumbar region: Secondary | ICD-10-CM

## 2022-02-09 NOTE — Progress Notes (Signed)
? ?Office Visit Note ?  ?Patient: Richard Marks           ?Date of Birth: 1986-03-20           ?MRN: [id number] ?Visit Date: 02/06/2022 ?             ?Requested by: Janeece Agee, NP [phone] ?[place],  [place] [address] ?PCP: Janeece Agee, NP ? ? ?Assessment & Plan: ?Visit Diagnoses:  ?1. Other intervertebral disc degeneration, lumbar region   ? ? ?Plan: Patient has single level disc degeneration mild disc bulge L5-S1.  He has not responded to conservative treatment including anti-inflammatories therapy exercises Tylenol, trochanteric injection.  We will set him up for single epidural injection.  He can follow-up with me in a few months. ? ?Follow-Up Instructions: No follow-ups on file.  ? ?Orders:  ?Orders Placed This Encounter  ?Procedures  ? Ambulatory referral to Physical Medicine Rehab  ? ?No orders of the defined types were placed in this encounter. ? ? ? ? Procedures: ?No procedures performed ? ? ?Clinical Data: ?No additional findings. ? ? ?Subjective: ?Chief Complaint  ?Patient presents with  ? Lower Back - Pain, Follow-up  ?  MRI lumbar review  ? ? ?HPI 36 year old male returns with ongoing problems with back pain left buttocks pain pain in his thigh.  He had trochanteric injection 12/16/2021 and states he did not get any relief.  Currently states his back is not hurting just taking pain in his thigh and leg.  MRI scan is reviewed today.  Patient's mother is with him again today he continues to have aching pain in his thigh.  Patient is working as an Financial controller. ? ?Review of Systems updated unchanged. ? ? ?Objective: ?Vital Signs: BP 127/87   Ht 5\' 11"  (1.803 m)   Wt 204 lb (92.5 kg)   BMI 28.45 kg/m?  ? ?Physical Exam ?Constitutional:   ?   Appearance: He is well-developed.  ?HENT:  ?   Head: Normocephalic and atraumatic.  ?   Right Ear: External ear normal.  ?   Left Ear: External ear normal.  ?Eyes:  ?   Pupils: Pupils are equal, round, and reactive to light.  ?Neck:  ?   Thyroid:  No thyromegaly.  ?   Trachea: No tracheal deviation.  ?Cardiovascular:  ?   Rate and Rhythm: Normal rate.  ?Pulmonary:  ?   Effort: Pulmonary effort is normal.  ?   Breath sounds: No wheezing.  ?Abdominal:  ?   General: Bowel sounds are normal.  ?   Palpations: Abdomen is soft.  ?Musculoskeletal:  ?   Cervical back: Neck supple.  ?Skin: ?   General: Skin is warm and dry.  ?   Capillary Refill: Capillary refill takes less than 2 seconds.  ?Neurological:  ?   Mental Status: He is alert and oriented to person, place, and time.  ?Psychiatric:     ?   Behavior: Behavior normal.     ?   Thought Content: Thought content normal.     ?   Judgment: Judgment normal.  ? ? ?Ortho Exam normal height no hand or foot deformity.  No dental crowding.  Negative logroll the hip short stride gait.  Negative straight leg raising 90 degrees.  Negative popliteal compression test.  Anterior tib EHL is intact. ? ?Specialty Comments:  ?No specialty comments available. ? ?Imaging: ?No results found. ? ? ?PMFS History: ?Patient Active Problem List  ? Diagnosis  Date Noted  ? Other intervertebral disc degeneration, lumbar region 07/08/2020  ? Low back pain 10/11/2019  ? Encounter to establish care 10/20/2017  ? Need for immunization against influenza 10/20/2017  ? [name] 07/20/2013  ? ?Past Medical History:  ?Diagnosis Date  ? [name]   ? Diagnosed at [facility] when patient was 37 years old  ?  ?Family History  ?Problem Relation Age of Onset  ? Diabetes Mother   ? Hypertension Mother   ? Hyperlipidemia Mother   ? Depression Mother   ? Hypertension Father   ? Diabetes Father   ? COPD Father   ? Alcohol abuse Sister   ? Asthma Sister   ? Depression Sister   ? Drug abuse Sister   ? Hypertension Sister   ? Hypertension Sister   ? Depression Sister   ? Diabetes Sister   ? Depression Brother   ? Diabetes Maternal Grandmother   ? Heart disease Maternal Grandmother   ? Hyperlipidemia Maternal Grandmother   ? Kidney disease  Maternal Grandmother   ? Heart disease Maternal Grandfather   ? Hypertension Maternal Grandfather   ? Kidney disease Maternal Grandfather   ? Arthritis Paternal Grandmother   ? Diabetes Paternal Grandmother   ? Heart disease Paternal Grandmother   ? Hyperlipidemia Paternal Grandmother   ? Hypertension Paternal Grandmother   ? Kidney disease Paternal Grandmother   ? Stroke Paternal Grandmother   ? Asthma Paternal Grandfather   ? COPD Paternal Grandfather   ? Hyperlipidemia Paternal Grandfather   ? Hypertension Paternal Grandfather   ? Kidney disease Paternal Grandfather   ?  ?Past Surgical History:  ?Procedure Laterality Date  ? NO PAST SURGERIES    ? ?Social History  ? ?Occupational History  ? Not on file  ?Tobacco Use  ? Smoking status: Never  ? Smokeless tobacco: Never  ?Vaping Use  ? Vaping Use: Never used  ?Substance and Sexual Activity  ? Alcohol use: No  ? Drug use: No  ? Sexual activity: Never  ? ? ? ? ? ? ?

## 2022-03-19 ENCOUNTER — Encounter: Payer: Self-pay | Admitting: Physical Medicine and Rehabilitation

## 2022-03-19 ENCOUNTER — Ambulatory Visit (INDEPENDENT_AMBULATORY_CARE_PROVIDER_SITE_OTHER): Payer: PPO | Admitting: Physical Medicine and Rehabilitation

## 2022-03-19 ENCOUNTER — Ambulatory Visit: Payer: Self-pay

## 2022-03-19 VITALS — BP 135/80 | HR 66

## 2022-03-19 DIAGNOSIS — M5416 Radiculopathy, lumbar region: Secondary | ICD-10-CM

## 2022-03-19 MED ORDER — METHYLPREDNISOLONE ACETATE 80 MG/ML IJ SUSP
80.0000 mg | Freq: Once | INTRAMUSCULAR | Status: AC
  Administered 2022-03-19: 80 mg

## 2022-03-19 NOTE — Procedures (Signed)
Lumbar Epidural Steroid Injection - Interlaminar Approach with Fluoroscopic Guidance ? ?Patient: Richard Marks      ?Date of Birth: Oct 15, 1986 ?MRN: [id number] ?PCP: Maximiano Coss, NP      ?Visit Date: 03/19/2022 ?  ?Universal Protocol:    ? ?Consent Given By: the patient ? ?Position: PRONE ? ?Additional Comments: ?Vital signs were monitored before and after the procedure. ?Patient was prepped and draped in the usual sterile fashion. ?The correct patient, procedure, and site was verified. ? ? ?Injection Procedure Details:  ? ?Procedure diagnoses: Lumbar radiculopathy [M54.16]  ? ?Meds Administered:  ?Meds ordered this encounter  ?Medications  ? methylPREDNISolone acetate (DEPO-MEDROL) injection 80 mg  ?  ? ?Laterality: Left ? ?Location/Site:  L5-S1 ? ?Needle: 3.5 in., 20 ga. Tuohy ? ?Needle Placement: Paramedian epidural ? ?Findings:  ? -Comments: Excellent flow of contrast into the epidural space. ? ?Procedure Details: ?Using a paramedian approach from the side mentioned above, the region overlying the inferior lamina was localized under fluoroscopic visualization and the soft tissues overlying this structure were infiltrated with 4 ml. of 1% Lidocaine without Epinephrine. The Tuohy needle was inserted into the epidural space using a paramedian approach.  ? ?The epidural space was localized using loss of resistance along with counter oblique bi-planar fluoroscopic views.  After negative aspirate for air, blood, and CSF, a 2 ml. volume of Isovue-250 was injected into the epidural space and the flow of contrast was observed. Radiographs were obtained for documentation purposes.   ? ?The injectate was administered into the level noted above. ? ? ?Additional Comments:  ?The patient tolerated the procedure well ?Dressing: 2 x 2 sterile gauze and Band-Aid ?  ? ?Post-procedure details: ?Patient was observed during the procedure. ?Post-procedure instructions were reviewed. ? ?Patient left the clinic in stable condition.   ?

## 2022-03-19 NOTE — Patient Instructions (Signed)

## 2022-03-19 NOTE — Progress Notes (Signed)
Pt state lower back pain that travels to his left leg. Pt state walking and than taking first step, sitting and standing makes the pain worse. Pt state he takes over the counter pain meds and uses pain cream to help ease his pain. ? ?Numeric Pain Rating Scale and Functional Assessment ?Average Pain 7 ? ? ?In the last MONTH (on 0-10 scale) has pain interfered with the following? ? ?1. General activity like being  able to carry out your everyday physical activities such as walking, climbing stairs, carrying groceries, or moving a chair?  ?Rating(10) ? ? ?+Driver, -BT, -Dye Allergies. ? ?

## 2022-03-19 NOTE — Progress Notes (Signed)
? ?Richard Marks - 36 y.o. male MRN [id number]  Date of birth: 24-Jan-1986 ? ?Office Visit Note: ?Visit Date: 03/19/2022 ?PCP: Janeece Agee, NP ?Referred by: Eldred Manges, MD ? ?Subjective: ?Chief Complaint  ?Patient presents with  ? Lower Back - Pain  ? Right Leg - Pain  ? ?HPI:  Richard Marks is a 36 y.o. male who comes in today at the request of Dr. Annell Greening for planned Left L5-S1 Lumbar Interlaminar epidural steroid injection with fluoroscopic guidance.  The patient has failed conservative care including home exercise, medications, time and activity modification.  This injection will be diagnostic and hopefully therapeutic.  Please see requesting physician notes for further details and justification. MRI reviewed with images and spine model.  MRI reviewed in the note below. ? ? ?ROS Otherwise per HPI. ? ?Assessment & Plan: ?Visit Diagnoses:  ?  ICD-10-CM   ?1. Lumbar radiculopathy  M54.16 XR C-ARM NO REPORT  ?  Epidural Steroid injection  ?  methylPREDNISolone acetate (DEPO-MEDROL) injection 80 mg  ?  ?  ?Plan: No additional findings.  ? ?Meds & Orders:  ?Meds ordered this encounter  ?Medications  ? methylPREDNISolone acetate (DEPO-MEDROL) injection 80 mg  ?  ?Orders Placed This Encounter  ?Procedures  ? XR C-ARM NO REPORT  ? Epidural Steroid injection  ?  ?Follow-up: Return for visit to requesting provider as needed.  ? ?Procedures: ?No procedures performed  ?Lumbar Epidural Steroid Injection - Interlaminar Approach with Fluoroscopic Guidance ? ?Patient: Richard Marks      ?Date of Birth: 1986-05-27 ?MRN: [id number] ?PCP: Janeece Agee, NP      ?Visit Date: 03/19/2022 ?  ?Universal Protocol:    ? ?Consent Given By: the patient ? ?Position: PRONE ? ?Additional Comments: ?Vital signs were monitored before and after the procedure. ?Patient was prepped and draped in the usual sterile fashion. ?The correct patient, procedure, and site was verified. ? ? ?Injection Procedure Details:  ? ?Procedure diagnoses:  Lumbar radiculopathy [M54.16]  ? ?Meds Administered:  ?Meds ordered this encounter  ?Medications  ? methylPREDNISolone acetate (DEPO-MEDROL) injection 80 mg  ?  ? ?Laterality: Left ? ?Location/Site:  L5-S1 ? ?Needle: 3.5 in., 20 ga. Tuohy ? ?Needle Placement: Paramedian epidural ? ?Findings:  ? -Comments: Excellent flow of contrast into the epidural space. ? ?Procedure Details: ?Using a paramedian approach from the side mentioned above, the region overlying the inferior lamina was localized under fluoroscopic visualization and the soft tissues overlying this structure were infiltrated with 4 ml. of 1% Lidocaine without Epinephrine. The Tuohy needle was inserted into the epidural space using a paramedian approach.  ? ?The epidural space was localized using loss of resistance along with counter oblique bi-planar fluoroscopic views.  After negative aspirate for air, blood, and CSF, a 2 ml. volume of Isovue-250 was injected into the epidural space and the flow of contrast was observed. Radiographs were obtained for documentation purposes.   ? ?The injectate was administered into the level noted above. ? ? ?Additional Comments:  ?The patient tolerated the procedure well ?Dressing: 2 x 2 sterile gauze and Band-Aid ?  ? ?Post-procedure details: ?Patient was observed during the procedure. ?Post-procedure instructions were reviewed. ? ?Patient left the clinic in stable condition.   ? ?Clinical History: ?MRI LUMBAR SPINE WITHOUT CONTRAST ?  ?TECHNIQUE: ?Multiplanar, multisequence MR imaging of the lumbar spine was ?performed. No intravenous contrast was administered. ?  ?COMPARISON:  06/29/2020 ?  ?FINDINGS: ?Segmentation:  Standard. ?  ?Alignment:  Mild  levocurvature.  No listhesis. ?  ?Vertebrae: No acute fracture or suspicious osseous lesion. Endplate ?degenerative changes at the left-greater-than-right aspect of L5-S1. ?  ?Conus medullaris and cauda equina: Conus extends to the L1 level. ?Conus and cauda equina appear  normal. ?  ?Paraspinal and other soft tissues: Negative. ?  ?Disc levels: ?  ?T12-L1: No significant disc bulge. No spinal canal stenosis or ?neural foraminal narrowing. ?  ?L1-L2: No significant disc bulge. No spinal canal stenosis or neural ?foraminal narrowing. ?  ?L2-L3: No significant disc bulge. No spinal canal stenosis or neural ?foraminal narrowing. ?  ?L3-L4: No significant disc bulge. No spinal canal stenosis or neural ?foraminal narrowing. ?  ?L4-L5: No significant disc bulge. No spinal canal stenosis or neural ?foraminal narrowing. ?  ?L5-S1: Disc desiccation and mild disc bulge. No spinal canal ?stenosis or neural foraminal narrowing. ?  ?IMPRESSION: ?No spinal canal stenosis or neural foraminal narrowing. ?  ?  ?Electronically Signed ?  By: Wiliam Ke M.D. ?  On: 01/09/2022 00:40  ? ? ? ?Objective:  VS:  HT:    WT:   BMI:     BP:135/80  HR:66bpm  TEMP: ( )  RESP:  ?Physical Exam ?Vitals and nursing note reviewed.  ?Constitutional:   ?   General: He is not in acute distress. ?   Appearance: Normal appearance. He is not ill-appearing.  ?HENT:  ?   Head: Normocephalic and atraumatic.  ?   Right Ear: External ear normal.  ?   Left Ear: External ear normal.  ?   Nose: No congestion.  ?Eyes:  ?   Extraocular Movements: Extraocular movements intact.  ?Cardiovascular:  ?   Rate and Rhythm: Normal rate.  ?   Pulses: Normal pulses.  ?Pulmonary:  ?   Effort: Pulmonary effort is normal. No respiratory distress.  ?Abdominal:  ?   General: There is no distension.  ?   Palpations: Abdomen is soft.  ?Musculoskeletal:     ?   General: No tenderness or signs of injury.  ?   Cervical back: Neck supple.  ?   Right lower leg: No edema.  ?   Left lower leg: No edema.  ?   Comments: Patient has good distal strength without clonus.  ?Skin: ?   Findings: No erythema or rash.  ?Neurological:  ?   General: No focal deficit present.  ?   Mental Status: He is alert and oriented to person, place, and time.  ?   Sensory: No  sensory deficit.  ?   Motor: No weakness or abnormal muscle tone.  ?   Coordination: Coordination normal.  ?Psychiatric:     ?   Mood and Affect: Mood normal.     ?   Behavior: Behavior normal.  ?  ? ?Imaging: ?No results found. ?

## 2022-06-02 ENCOUNTER — Ambulatory Visit (INDEPENDENT_AMBULATORY_CARE_PROVIDER_SITE_OTHER): Payer: PPO | Admitting: Orthopaedic Surgery

## 2022-06-02 ENCOUNTER — Encounter: Payer: Self-pay | Admitting: Orthopaedic Surgery

## 2022-06-02 VITALS — BP 123/83 | HR 150 | Ht 71.0 in | Wt 204.0 lb

## 2022-06-02 DIAGNOSIS — M5136 Other intervertebral disc degeneration, lumbar region: Secondary | ICD-10-CM | POA: Diagnosis not present

## 2022-06-02 NOTE — Progress Notes (Signed)
Office Visit Note   Patient: Richard Marks           Date of Birth: [birth year]           MRN: [id number] Visit Date: 06/02/2022              Requested by: Janeece Agee, NP [address] [place],  [place] [address] PCP: Janeece Agee, NP   Assessment & Plan: Visit Diagnoses:  1. Other intervertebral disc degeneration, lumbar region     Plan: Patient with endplate degenerative changes single level L5-S1 with disc desiccation and possible tiny protrusion without compression.  Discussed the Modic changes present with disc degeneration at the L5-S1 level.  I will recheck him in 6 months.  He can continue to try to be active.  He can use some ibuprofen or Tylenol if needed.  Follow-Up Instructions: Return in about 6 months (around 12/03/2022).   Orders:  No orders of the defined types were placed in this encounter.  No orders of the defined types were placed in this encounter.     Procedures: No procedures performed   Clinical Data: No additional findings.   Subjective: Chief Complaint  Patient presents with   Lower Back - Pain    HPI 36 year old male here with his mother with ongoing problems with back pain.  He has Modic changes L5-S1.  He has a diagnosis of Richard Marks.  With his back symptoms and get temporary relief the epidural he got some relief with therapies some relief when he takes anti-inflammatories.  Increased back aching symptoms with activity primarily lumbosacral junction and over to the left side none down his leg.  Review of Systems all other systems updated unchanged   Objective: Vital Signs: BP 123/83   Pulse (!) 150   Ht 5\' 11"  (1.803 m)   Wt 204 lb (92.5 kg)   BMI 28.45 kg/m   Physical Exam Constitutional:      Appearance: He is well-developed.  HENT:     Head: Normocephalic and atraumatic.     Right Ear: External ear normal.     Left Ear: External ear normal.  Eyes:     Pupils: Pupils are equal, round, and reactive to  light.  Neck:     Thyroid: No thyromegaly.     Trachea: No tracheal deviation.  Cardiovascular:     Rate and Rhythm: Normal rate.  Pulmonary:     Effort: Pulmonary effort is normal.     Breath sounds: No wheezing.  Abdominal:     General: Bowel sounds are normal.     Palpations: Abdomen is soft.  Musculoskeletal:     Cervical back: Neck supple.  Skin:    General: Skin is warm and dry.     Capillary Refill: Capillary refill takes less than 2 seconds.  Neurological:     Mental Status: He is alert and oriented to person, place, and time.  Psychiatric:        Behavior: Behavior normal.     Ortho Exam negative straight leg raising 9 degrees negative sciatic notch tenderness negative logroll to the hips.  Negative popliteal compression test no atrophy lower extremities normal heel-toe gait.  Specialty Comments:  MRI LUMBAR SPINE WITHOUT CONTRAST   TECHNIQUE: Multiplanar, multisequence MR imaging of the lumbar spine was performed. No intravenous contrast was administered.   COMPARISON:  06/29/2020   FINDINGS: Segmentation:  Standard.   Alignment:  Mild levocurvature.  No listhesis.   Vertebrae: No  acute fracture or suspicious osseous lesion. Endplate degenerative changes at the left-greater-than-right aspect of L5-S1.   Conus medullaris and cauda equina: Conus extends to the L1 level. Conus and cauda equina appear normal.   Paraspinal and other soft tissues: Negative.   Disc levels:   T12-L1: No significant disc bulge. No spinal canal stenosis or neural foraminal narrowing.   L1-L2: No significant disc bulge. No spinal canal stenosis or neural foraminal narrowing.   L2-L3: No significant disc bulge. No spinal canal stenosis or neural foraminal narrowing.   L3-L4: No significant disc bulge. No spinal canal stenosis or neural foraminal narrowing.   L4-L5: No significant disc bulge. No spinal canal stenosis or neural foraminal narrowing.   L5-S1: Disc  desiccation and mild disc bulge. No spinal canal stenosis or neural foraminal narrowing.   IMPRESSION: No spinal canal stenosis or neural foraminal narrowing.     Electronically Signed   By: Wiliam Ke M.D.   On: 01/09/2022 00:40  Imaging: No results found.   PMFS History: Patient Active Problem List   Diagnosis Date Noted   Other intervertebral disc degeneration, lumbar region 07/08/2020   Low back pain 10/11/2019   Encounter to establish care 10/20/2017   Need for immunization against influenza 10/20/2017   Richard Marks 07/20/2013   Past Medical History:  Diagnosis Date   Richard Marks    Diagnosed at [facility] when patient was 36 years old    Family History  Problem Relation Age of Onset   Diabetes Mother    Hypertension Mother    Hyperlipidemia Mother    Depression Mother    Hypertension Father    Diabetes Father    COPD Father    Alcohol abuse Sister    Asthma Sister    Depression Sister    Drug abuse Sister    Hypertension Sister    Hypertension Sister    Depression Sister    Diabetes Sister    Depression Brother    Diabetes Maternal Grandmother    Heart disease Maternal Grandmother    Hyperlipidemia Maternal Grandmother    Kidney disease Maternal Grandmother    Heart disease Maternal Grandfather    Hypertension Maternal Grandfather    Kidney disease Maternal Grandfather    Arthritis Paternal Grandmother    Diabetes Paternal Grandmother    Heart disease Paternal Grandmother    Hyperlipidemia Paternal Grandmother    Hypertension Paternal Grandmother    Kidney disease Paternal Grandmother    Stroke Paternal Grandmother    Asthma Paternal Grandfather    COPD Paternal Grandfather    Hyperlipidemia Paternal Grandfather    Hypertension Paternal Grandfather    Kidney disease Paternal Grandfather     Past Surgical History:  Procedure Laterality Date   NO PAST SURGERIES     Social History   Occupational History   Not on file   Tobacco Use   Smoking status: Never   Smokeless tobacco: Never  Vaping Use   Vaping Use: Never used  Substance and Sexual Activity   Alcohol use: No   Drug use: No   Sexual activity: Never

## 2022-06-25 DIAGNOSIS — H52533 Spasm of accommodation, bilateral: Secondary | ICD-10-CM | POA: Diagnosis not present

## 2022-06-25 DIAGNOSIS — R519 Headache, unspecified: Secondary | ICD-10-CM | POA: Diagnosis not present

## 2022-08-05 ENCOUNTER — Encounter: Payer: PPO | Admitting: Registered Nurse

## 2022-08-07 ENCOUNTER — Encounter: Payer: Self-pay | Admitting: Family Medicine

## 2022-08-07 ENCOUNTER — Ambulatory Visit (INDEPENDENT_AMBULATORY_CARE_PROVIDER_SITE_OTHER): Payer: PPO | Admitting: Family Medicine

## 2022-08-07 VITALS — BP 128/74 | HR 60 | Temp 97.6°F | Resp 16 | Ht 71.0 in | Wt 213.0 lb

## 2022-08-07 DIAGNOSIS — Z23 Encounter for immunization: Secondary | ICD-10-CM

## 2022-08-07 DIAGNOSIS — E663 Overweight: Secondary | ICD-10-CM

## 2022-08-07 DIAGNOSIS — Z0000 Encounter for general adult medical examination without abnormal findings: Secondary | ICD-10-CM

## 2022-08-07 LAB — CBC WITH DIFFERENTIAL/PLATELET
Basophils Absolute: 0 10*3/uL (ref 0.0–0.1)
Basophils Relative: 0.5 % (ref 0.0–3.0)
Eosinophils Absolute: 0.1 10*3/uL (ref 0.0–0.7)
Eosinophils Relative: 1 % (ref 0.0–5.0)
HCT: 44.7 % (ref 39.0–52.0)
Hemoglobin: 15.5 g/dL (ref 13.0–17.0)
Lymphocytes Relative: 29.9 % (ref 12.0–46.0)
Lymphs Abs: 1.9 10*3/uL (ref 0.7–4.0)
MCHC: 34.6 g/dL (ref 30.0–36.0)
MCV: 97.7 fl (ref 78.0–100.0)
Monocytes Absolute: 0.4 10*3/uL (ref 0.1–1.0)
Monocytes Relative: 6 % (ref 3.0–12.0)
Neutro Abs: 4.1 10*3/uL (ref 1.4–7.7)
Neutrophils Relative %: 62.6 % (ref 43.0–77.0)
Platelets: 206 10*3/uL (ref 150.0–400.0)
RBC: 4.58 Mil/uL (ref 4.22–5.81)
RDW: 12.8 % (ref 11.5–15.5)
WBC: 6.5 10*3/uL (ref 4.0–10.5)

## 2022-08-07 LAB — BASIC METABOLIC PANEL
BUN: 11 mg/dL (ref 6–23)
CO2: 30 mEq/L (ref 19–32)
Calcium: 9.5 mg/dL (ref 8.4–10.5)
Chloride: 103 mEq/L (ref 96–112)
Creatinine, Ser: 1.07 mg/dL (ref 0.40–1.50)
GFR: 89.53 mL/min (ref >60.00)
Glucose, Bld: 82 mg/dL (ref 70–99)
Potassium: 4.2 mEq/L (ref 3.5–5.1)
Sodium: 139 mEq/L (ref 135–145)

## 2022-08-07 LAB — LIPID PANEL
Cholesterol: 194 mg/dL (ref 0–200)
HDL: 31 mg/dL — ABNORMAL LOW (ref >39.00)
LDL Cholesterol: 124 mg/dL — ABNORMAL HIGH (ref 0–99)
NonHDL: 162.78
Total CHOL/HDL Ratio: 6
Triglycerides: 194 mg/dL — ABNORMAL HIGH (ref 0.0–149.0)
VLDL: 38.8 mg/dL (ref 0.0–40.0)

## 2022-08-07 LAB — HEPATIC FUNCTION PANEL
ALT: 16 U/L (ref 0–53)
AST: 19 U/L (ref 0–37)
Albumin: 4.6 g/dL (ref 3.5–5.2)
Alkaline Phosphatase: 54 U/L (ref 39–117)
Bilirubin, Direct: 0.1 mg/dL (ref 0.0–0.3)
Total Bilirubin: 0.4 mg/dL (ref 0.2–1.2)
Total Protein: 7.7 g/dL (ref 6.0–8.3)

## 2022-08-07 LAB — TSH: TSH: 1.31 u[IU]/mL (ref 0.35–5.50)

## 2022-08-07 NOTE — Assessment & Plan Note (Signed)
Pt's PE WNL w/ exception of BMI.  Flu shot given.  Pt will get Tdap at next visit as we don't have any in office today.  Check labs.  Anticipatory guidance provided.

## 2022-08-07 NOTE — Progress Notes (Signed)
   Subjective:    Patient ID: Richard Marks, male    DOB: 1986-05-29, 36 y.o.   MRN: [id number]  HPI CPE- due for flu and Tdap.    Health Maintenance  Topic Date Due   TETANUS/TDAP  Never done   INFLUENZA VACCINE  06/16/2022   Hepatitis C Screening  Completed   HPV VACCINES  Aged Out   COVID-19 Vaccine  Discontinued   HIV Screening  Discontinued      Review of Systems Patient reports no vision/hearing changes, anorexia, fever ,adenopathy, persistant/recurrent hoarseness, swallowing issues, chest pain, palpitations, edema, persistant/recurrent cough, hemoptysis, dyspnea (rest,exertional, paroxysmal nocturnal), gastrointestinal  bleeding (melena, rectal bleeding), abdominal pain, GU symptoms (dysuria, hematuria, voiding/incontinence issues) syncope, focal weakness, memory loss, skin/hair/nail changes, depression, anxiety, abnormal bruising/bleeding, musculoskeletal symptoms/signs.   L leg numbness and tingling due to chronic back issues + GERD- 'random'    Objective:   Physical Exam General Appearance:    Alert, cooperative, no distress, appears stated age  Head:    Normocephalic, without obvious abnormality, atraumatic  Eyes:    PERRL, conjunctiva/corneas clear, EOM's intact both eyes       Ears:    Normal TM's and external ear canals, both ears  Nose:   Nares normal, septum midline, mucosa normal, no drainage   or sinus tenderness  Throat:   Lips, mucosa, and tongue normal; teeth and gums normal  Neck:   Supple, symmetrical, trachea midline, no adenopathy;       thyroid:  No enlargement/tenderness/nodules  Back:     Symmetric, no curvature, ROM normal, no CVA tenderness  Lungs:     Clear to auscultation bilaterally, respirations unlabored  Chest wall:    No tenderness or deformity  Heart:    Regular rate and rhythm, S1 and S2 normal, no murmur, rub   or gallop  Abdomen:     Soft, non-tender, bowel sounds active all four quadrants,    no masses, no organomegaly  Genitalia:     deferred  Rectal:    Extremities:   Extremities normal, atraumatic, no cyanosis or edema  Pulses:   2+ and symmetric all extremities  Skin:   Skin color, texture, turgor normal, no rashes or lesions  Lymph nodes:   Cervical, supraclavicular, and axillary nodes normal  Neurologic:   CNII-XII intact. Normal strength, sensation and reflexes      throughout          Assessment & Plan:

## 2022-08-07 NOTE — Assessment & Plan Note (Signed)
Pt's BMI 29.71  Encouraged healthy diet and regular exercise.  Check labs to risk stratify.  Will follow.

## 2022-08-07 NOTE — Patient Instructions (Signed)
Follow up in 1 year or as needed We'll notify you of your lab results and make any changes if needed Continue to work on healthy diet and regular exercise- you can do it! Call with any questions or concerns Stay Safe!  Stay Healthy! Welcome!  We're glad to have you!!!

## 2022-08-10 NOTE — Progress Notes (Signed)
Informed pt of lab results  

## 2022-10-22 ENCOUNTER — Ambulatory Visit (INDEPENDENT_AMBULATORY_CARE_PROVIDER_SITE_OTHER): Payer: PPO | Admitting: *Deleted

## 2022-10-22 DIAGNOSIS — Z0000 Encounter for general adult medical examination without abnormal findings: Secondary | ICD-10-CM

## 2022-10-22 NOTE — Progress Notes (Signed)
Subjective:   Richard Marks is a 36 y.o. male who presents for Medicare Annual/Subsequent preventive examination.  I connected with  [name] on 10/22/22 by a telephone enabled telemedicine application and verified that I am speaking with the correct person using two identifiers.   I discussed the limitations of evaluation and management by telemedicine. The patient expressed understanding and agreed to proceed.  Patient location: home  Provider location: Tele-health-home    Review of Systems     Cardiac Risk Factors include: advanced age (>71men, >72 women);male gender     Objective:    Today's Vitals   There is no height or weight on file to calculate BMI.     10/22/2022    9:49 AM 10/26/2019    4:23 PM 05/11/2016    4:15 AM  Advanced Directives  Does Patient Have a Medical Advance Directive? No No No  Would patient like information on creating a medical advance directive? No - Patient declined No - Patient declined No - patient declined information    Current Medications (verified) No outpatient encounter medications on file as of 10/22/2022.   No facility-administered encounter medications on file as of 10/22/2022.    Allergies (verified) Patient has no known allergies.   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   Past Surgical History:  Procedure Laterality Date   NO PAST SURGERIES     Family History  Problem Relation Age of Onset   Diabetes Mother    Hypertension Mother    Hyperlipidemia Mother    Depression Mother    Hypertension Father    Diabetes Father    COPD Father    Alcohol abuse Sister    Asthma Sister    Depression Sister    Drug abuse Sister    Hypertension Sister    Hypertension Sister    Depression Sister    Diabetes Sister    Depression Brother    Diabetes Maternal Grandmother    Heart disease Maternal Grandmother    Hyperlipidemia  Maternal Grandmother    Kidney disease Maternal Grandmother    Heart disease Maternal Grandfather    Hypertension Maternal Grandfather    Kidney disease Maternal Grandfather    Arthritis Paternal Grandmother    Diabetes Paternal Grandmother    Heart disease Paternal Grandmother    Hyperlipidemia Paternal Grandmother    Hypertension Paternal Grandmother    Kidney disease Paternal Grandmother    Stroke Paternal Grandmother    Asthma Paternal Grandfather    COPD Paternal Grandfather    Hyperlipidemia Paternal Grandfather    Hypertension Paternal Grandfather    Kidney disease Paternal Grandfather    Social History   Socioeconomic History   Marital status: Single    Spouse name: Not on file   Number of children: Not on file   Years of education: Not on file   Highest education level: Not on file  Occupational History   Not on file  Tobacco Use   Smoking status: Never   Smokeless tobacco: Never  Vaping Use   Vaping Use: Never used  Substance and Sexual Activity   Alcohol use: No   Drug use: No   Sexual activity: Never  Other Topics Concern   Not on file  Social History Narrative   Learning disability   Non smoker   Occasional drinker   High school graduate   Lives  at home with mother and 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)   Social Determinants of Corporate investment banker Strain: Low Risk  (10/22/2022)   Overall Financial Resource Strain (CARDIA)    Difficulty of Paying Living Expenses: Not hard at all  Food Insecurity: No Food Insecurity (10/22/2022)   Hunger Vital Sign    Worried About Running Out of Food in the Last Year: Never true    Ran Out of Food in the Last Year: Never true  Transportation Needs: No Transportation Needs (10/22/2022)   PRAPARE - Administrator, Civil Service (Medical): No    Lack of Transportation (Non-Medical): No  Physical Activity: Insufficiently Active (10/22/2022)   Exercise Vital Sign    Days of Exercise per  Week: 4 days    Minutes of Exercise per Session: 30 min  Stress: No Stress Concern Present (10/22/2022)   [employer] of Occupational Health - Occupational Stress Questionnaire    Feeling of Stress : Not at all  Social Connections: Socially Isolated (10/22/2022)   Social Connection and Isolation Panel [NHANES]    Frequency of Communication with Friends and Family: Three times a week    Frequency of Social Gatherings with Friends and Family: Once a week    Attends Religious Services: Never    Database administrator or Organizations: No    Attends Engineer, structural: Never    Marital Status: Never married    Tobacco Counseling Counseling given: Not Answered   Clinical Intake:  Pre-visit preparation completed: Yes  Pain : No/denies pain     Diabetes: No  How often do you need to have someone help you when you read instructions, pamphlets, or other written materials from your doctor or pharmacy?: 1 - Never  Diabetic?  no  Interpreter Needed?: No  Information entered by :: Remi Haggard LPN   Activities of Daily Living    10/22/2022    9:57 AM 08/07/2022   12:52 PM  In your present state of health, do you have any difficulty performing the following activities:  Hearing? 0 0  Vision? 0 0  Difficulty concentrating or making decisions? 0 0  Walking or climbing stairs? 0 0  Dressing or bathing? 0 0  Doing errands, shopping? 0 0  Preparing Food and eating ? N   Using the Toilet? N   In the past six months, have you accidently leaked urine? N   Do you have problems with loss of bowel control? N   Managing your Medications? N   Managing your Finances? N   Housekeeping or managing your Housekeeping? N     Patient Care Team: Sheliah Hatch, MD as PCP - General (Family Medicine)  Indicate any recent Medical Services you may have received from other than [facility] providers in the past year (date may be approximate).     Assessment:   This is a routine  wellness examination for Richard Marks.  Hearing/Vision screen Hearing Screening - Comments:: No trouble hearing Vision Screening - Comments:: Up to date [place] unsure of name  Dietary issues and exercise activities discussed: Current Exercise Habits: Home exercise routine, Type of exercise: walking, Time (Minutes): 30, Frequency (Times/Week): 4, Weekly Exercise (Minutes/Week): 120, Intensity: Mild   Goals Addressed             This Visit's Progress    Patient Stated       Continue current lifestyle  Depression Screen    10/22/2022    9:55 AM 08/07/2022   12:51 PM 09/14/2019    9:04 AM 10/20/2017    9:11 AM 07/20/2013    1:24 PM 07/21/2011    3:46 PM  PHQ 2/9 Scores  PHQ - 2 Score 0 3 0 0 0 0  PHQ- 9 Score 0 7 0       Fall Risk    10/22/2022    9:49 AM 08/07/2022   12:51 PM 07/30/2021    8:11 AM 09/14/2019    9:04 AM 10/20/2017    9:11 AM  Fall Risk   Falls in the past year? 0 0 0 0 No  Number falls in past yr: 0  0 0   Injury with Fall? 0  0 0   Risk for fall due to :  No Fall Risks No Fall Risks    Follow up Falls evaluation completed;Education provided;Falls prevention discussed Falls evaluation completed Falls evaluation completed Falls evaluation completed     FALL RISK PREVENTION PERTAINING TO THE HOME:  Any stairs in or around the home? No  If so, are there any without handrails? No  Home free of loose throw rugs in walkways, pet beds, electrical cords, etc? Yes  Adequate lighting in your home to reduce risk of falls? Yes   ASSISTIVE DEVICES UTILIZED TO PREVENT FALLS:  Life alert? No  Use of a cane, walker or w/c? No  Grab bars in the bathroom? Yes  Shower chair or bench in shower? Yes  Elevated toilet seat or a handicapped toilet? Yes   TIMED UP AND GO:  Was the test performed? No .    Cognitive Function:        10/22/2022    9:51 AM  6CIT Screen  What Year? 0 points  What month? 0 points  What time? 0 points  Count back from 20 0  points  Months in reverse 4 points  Repeat phrase 0 points  Total Score 4 points    Immunizations Immunization History  Administered Date(s) Administered   Influenza,inj,Quad PF,6+ Mos 07/20/2013, 10/20/2017, 07/30/2021, 08/07/2022    TDAP status: Due, Education has been provided regarding the importance of this vaccine. Advised may receive this vaccine at local pharmacy or Health Dept. Aware to provide a copy of the vaccination record if obtained from local pharmacy or Health Dept. Verbalized acceptance and understanding.  Flu Vaccine status: Up to date    Covid-19 vaccine status: Information provided on how to obtain vaccines.   Qualifies for Shingles Vaccine? No       Screening Tests Health Maintenance  Topic Date Due   DTaP/Tdap/Td (1 - Tdap) Never done   Medicare Annual Wellness (AWV)  10/23/2023   INFLUENZA VACCINE  Completed   Hepatitis C Screening  Completed   HPV VACCINES  Aged Out   COVID-19 Vaccine  Discontinued   HIV Screening  Discontinued    Health Maintenance  Health Maintenance Due  Topic Date Due   DTaP/Tdap/Td (1 - Tdap) Never done      Lung Cancer Screening: (Low Dose CT Chest recommended if Age 67-80 years, 30 pack-year currently smoking OR have quit w/in 15years.) does not qualify.   Lung Cancer Screening Referral:   Additional Screening:  Hepatitis C Screening: does not qualify; Completed 2022  Vision Screening: Recommended annual ophthalmology exams for early detection of glaucoma and other disorders of the eye. Is the patient up to date with their annual eye exam?  Yes  Who is the provider or what is the name of the office in which the patient attends annual eye exams? Unsure of name   [place] If pt is not established with a provider, would they like to be referred to a provider to establish care? No .   Dental Screening: Recommended annual dental exams for proper oral hygiene  Community Resource Referral / Chronic Care  Management: CRR required this visit?  No   CCM required this visit?  No      Plan:     I have personally reviewed and noted the following in the patient's chart:   Medical and social history Use of alcohol, tobacco or illicit drugs  Current medications and supplements including opioid prescriptions. Patient is not currently taking opioid prescriptions. Functional ability and status Nutritional status Physical activity Advanced directives List of other physicians Hospitalizations, surgeries, and ER visits in previous 12 months Vitals Screenings to include cognitive, depression, and falls Referrals and appointments  In addition, I have reviewed and discussed with patient certain preventive protocols, quality metrics, and best practice recommendations. A written personalized care plan for preventive services as well as general preventive health recommendations were provided to patient.     Remi HaggardJulie Jailah Willis, LPN   40/9/811912/05/2022   Nurse Notes:

## 2022-10-22 NOTE — Patient Instructions (Signed)
Richard Marks , Thank you for taking time to come for your Medicare Wellness Visit. I appreciate your ongoing commitment to your health goals. Please review the following plan we discussed and let me know if I can assist you in the future.   These are the goals we discussed:  Goals      Patient Stated     Continue current lifestyle         This is a list of the screening recommended for you and due dates:  Health Maintenance  Topic Date Due   DTaP/Tdap/Td vaccine (1 - Tdap) Never done   Medicare Annual Wellness Visit  10/23/2023   Flu Shot  Completed   Hepatitis C Screening: USPSTF Recommendation to screen - Ages 18-79 yo.  Completed   HPV Vaccine  Aged Out   COVID-19 Vaccine  Discontinued   HIV Screening  Discontinued    Advanced directives: Education provided  Preventive Care 20-59 Years Old, Male Preventive care refers to lifestyle choices and visits with your health care provider that can promote health and wellness. Preventive care visits are also called wellness exams. What can I expect for my preventive care visit? Counseling During your preventive care visit, your health care provider may ask about your: Medical history, including: Past medical problems. Family medical history. Current health, including: Emotional well-being. Home life and relationship well-being. Sexual activity. Lifestyle, including: Alcohol, nicotine or tobacco, and drug use. Access to firearms. Diet, exercise, and sleep habits. Safety issues such as seatbelt and bike helmet use. Sunscreen use. Work and work Astronomer. Physical exam Your health care provider may check your: Height and weight. These may be used to calculate your BMI (body mass index). BMI is a measurement that tells if you are at a healthy weight. Waist circumference. This measures the distance around your waistline. This measurement also tells if you are at a healthy weight and may help predict your risk of certain diseases,  such as type 2 diabetes and high blood pressure. Heart rate and blood pressure. Body temperature. Skin for abnormal spots. What immunizations do I need? Vaccines are usually given at various ages, according to a schedule. Your health care provider will recommend vaccines for you based on your age, medical history, and lifestyle or other factors, such as travel or where you work. What tests do I need? Screening Your health care provider may recommend screening tests for certain conditions. This may include: Lipid and cholesterol levels. Diabetes screening. This is done by checking your blood sugar (glucose) after you have not eaten for a while (fasting). Hepatitis B test. Hepatitis C test. HIV (human immunodeficiency virus) test. STI (sexually transmitted infection) testing, if you are at risk. Talk with your health care provider about your test results, treatment options, and if necessary, the need for more tests. Follow these instructions at home: Eating and drinking  Eat a healthy diet that includes fresh fruits and vegetables, whole grains, lean protein, and low-fat dairy products. Drink enough fluid to keep your urine pale yellow. Take vitamin and mineral supplements as recommended by your health care provider. Do not drink alcohol if your health care provider tells you not to drink. If you drink alcohol: Limit how much you have to 0-2 drinks a day. Know how much alcohol is in your drink. In the [place] one drink equals one 12 oz bottle of beer (355 mL), one 5 oz glass of wine (148 mL), or one 1 oz glass of hard liquor (44 mL). Lifestyle  Brush your teeth every morning and night with fluoride toothpaste. Floss one time each day. Exercise for at least 30 minutes 5 or more days each week. Do not use any products that contain nicotine or tobacco. These products include cigarettes, chewing tobacco, and vaping devices, such as e-cigarettes. If you need help quitting, ask your health care  provider. Do not use drugs. If you are sexually active, practice safe sex. Use a condom or other form of protection to prevent STIs. Find healthy ways to manage stress, such as: Meditation, yoga, or listening to music. Journaling. Talking to a trusted person. Spending time with friends and family. Minimize exposure to UV radiation to reduce your risk of skin cancer. Safety Always wear your seat belt while driving or riding in a vehicle. Do not drive: If you have been drinking alcohol. Do not ride with someone who has been drinking. If you have been using any mind-altering substances or drugs. While texting. When you are tired or distracted. Wear a helmet and other protective equipment during sports activities. If you have firearms in your house, make sure you follow all gun safety procedures. Seek help if you have been physically or sexually abused. What's next? Go to your health care provider once a year for an annual wellness visit. Ask your health care provider how often you should have your eyes and teeth checked. Stay up to date on all vaccines. This information is not intended to replace advice given to you by your health care provider. Make sure you discuss any questions you have with your health care provider. Document Revised: 04/30/2021 Document Reviewed: 04/30/2021 Elsevier Patient Education  2022 [employer].

## 2022-12-01 ENCOUNTER — Ambulatory Visit: Payer: PPO | Admitting: Orthopaedic Surgery

## 2022-12-18 ENCOUNTER — Ambulatory Visit (INDEPENDENT_AMBULATORY_CARE_PROVIDER_SITE_OTHER): Payer: PPO

## 2022-12-18 ENCOUNTER — Ambulatory Visit (INDEPENDENT_AMBULATORY_CARE_PROVIDER_SITE_OTHER): Payer: PPO | Admitting: Orthopaedic Surgery

## 2022-12-18 ENCOUNTER — Encounter: Payer: Self-pay | Admitting: Orthopaedic Surgery

## 2022-12-18 VITALS — BP 130/85 | HR 76 | Ht 71.0 in | Wt 200.0 lb

## 2022-12-18 DIAGNOSIS — M545 Low back pain, unspecified: Secondary | ICD-10-CM | POA: Diagnosis not present

## 2022-12-18 DIAGNOSIS — G8929 Other chronic pain: Secondary | ICD-10-CM | POA: Diagnosis not present

## 2022-12-18 DIAGNOSIS — M25551 Pain in right hip: Secondary | ICD-10-CM | POA: Diagnosis not present

## 2022-12-18 NOTE — Progress Notes (Signed)
Office Visit Note   Patient: Richard Marks           Date of Birth: 02-25-1986           MRN: [id number] Visit Date: 12/18/2022              Requested by: Maximiano Coss, NP [place],  [place] [id number] PCP: Midge Minium, MD   Assessment & Plan: Visit Diagnoses:  1. Chronic bilateral low back pain, unspecified whether sciatica present   2. Pain in right hip     Plan: We reviewed MRI with patient and mother once again.  He has disc degeneration with endplate changes at I0-X7.  We discussed the natural history of this and usual improvement in symptoms with time.  He can return in 6 months if he is still continue to have pain or returns he develops progressive weakness.  He will try to avoid the positions with bending and twisting that tends to aggravate his back currently.  Follow-Up Instructions: Return if symptoms worsen or fail to improve.   Orders:  Orders Placed This Encounter  Procedures   XR Lumbar Spine 2-3 Views   XR HIP UNILAT W OR W/O PELVIS 2-3 VIEWS RIGHT   No orders of the defined types were placed in this encounter.     Procedures: No procedures performed   Clinical Data: No additional findings.   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  Endplate edematous changes at L5-S1 with mild narrowing.  He has had persistent symptoms and has had therapy in the past.  Recently has had increased pain in his right groin.  He has used Advil but only takes 2 tablets.  He thinks he notes some improvement.  He has had previous home exercise program Tylenol trochanteric injection single epidural without sustained relief.  Review of Systems updated unchanged   Objective: Vital Signs: BP 130/85   Pulse 76   Ht 5\' 11"  (1.803 m)   Wt 200 lb (90.7 kg)   BMI 27.89 kg/m   Physical Exam Constitutional:      Appearance: He is well-developed.  HENT:      Head: Normocephalic and atraumatic.     Right Ear: External ear normal.     Left Ear: External ear normal.  Eyes:     Pupils: Pupils are equal, round, and reactive to light.  Neck:     Thyroid: No thyromegaly.     Trachea: No tracheal deviation.  Cardiovascular:     Rate and Rhythm: Normal rate.  Pulmonary:     Effort: Pulmonary effort is normal.     Breath sounds: No wheezing.  Abdominal:     General: Bowel sounds are normal.     Palpations: Abdomen is soft.  Musculoskeletal:     Cervical back: Neck supple.  Skin:    General: Skin is warm and dry.     Capillary Refill: Capillary refill takes less than 2 seconds.  Neurological:     Mental Status: He is alert and oriented to person, place, and time.  Psychiatric:        Behavior: Behavior normal.        Thought Content: Thought content normal.        Judgment: Judgment normal.     Ortho Exam negative logroll hips negative straight leg raising 90 degrees anterior tib gastrocsoleus  is strong no sciatic notch tenderness some tenderness to palpation over the lumbar spine.  Specialty Comments:  MRI LUMBAR SPINE WITHOUT CONTRAST   TECHNIQUE: Multiplanar, multisequence MR imaging of the lumbar spine was performed. No intravenous contrast was administered.   COMPARISON:  06/29/2020   FINDINGS: Segmentation:  Standard.   Alignment:  Mild levocurvature.  No listhesis.   Vertebrae: No acute fracture or suspicious osseous lesion. Endplate degenerative changes at the left-greater-than-right aspect of L5-S1.   Conus medullaris and cauda equina: Conus extends to the L1 level. Conus and cauda equina appear normal.   Paraspinal and other soft tissues: Negative.   Disc levels:   T12-L1: No significant disc bulge. No spinal canal stenosis or neural foraminal narrowing.   L1-L2: No significant disc bulge. No spinal canal stenosis or neural foraminal narrowing.   L2-L3: No significant disc bulge. No spinal canal stenosis  or neural foraminal narrowing.   L3-L4: No significant disc bulge. No spinal canal stenosis or neural foraminal narrowing.   L4-L5: No significant disc bulge. No spinal canal stenosis or neural foraminal narrowing.   L5-S1: Disc desiccation and mild disc bulge. No spinal canal stenosis or neural foraminal narrowing.   IMPRESSION: No spinal canal stenosis or neural foraminal narrowing.     Electronically Signed   By: Merilyn Baba M.D.   On: 01/09/2022 00:40  Imaging: XR HIP UNILAT W OR W/O PELVIS 2-3 VIEWS RIGHT  Result Date: 12/18/2022 AP pelvis frog-leg right hip obtained and reviewed.  This shows normal hip joint no soft tissue calcification pelvis is normal sacroiliac joints are unremarkable. Impression: Normal right hip radiographs.  XR Lumbar Spine 2-3 Views  Result Date: 12/18/2022 AP lateral lumbar spine images are obtained and reviewed.  Comparison to 08/01/2021 images.  Again noted slight narrowing L5-S1 without listhesis.  No other changes at other levels. Impression: Stable lumbar spine slight narrowing L5-S1 otherwise normal radiographs.    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/20/2013   Past Medical History:  Diagnosis Date   [name]    Diagnosed at [facility] when patient was 37 years old    Family History  Problem Relation Age of Onset   Diabetes Mother    Hypertension Mother    Hyperlipidemia Mother    Depression Mother    Hypertension Father    Diabetes Father    COPD Father    Alcohol abuse Sister    Asthma Sister    Depression Sister    Drug abuse Sister    Hypertension Sister    Hypertension Sister    Depression Sister    Diabetes Sister    Depression Brother     Diabetes Maternal Grandmother    Heart disease Maternal Grandmother    Hyperlipidemia Maternal Grandmother    Kidney disease Maternal Grandmother    Heart disease Maternal Grandfather    Hypertension Maternal Grandfather    Kidney disease Maternal Grandfather    Arthritis Paternal Grandmother    Diabetes Paternal Grandmother    Heart disease Paternal Grandmother    Hyperlipidemia Paternal Grandmother    Hypertension Paternal Grandmother    Kidney disease Paternal Grandmother    Stroke Paternal Grandmother    Asthma Paternal Grandfather    COPD  Paternal Grandfather    Hyperlipidemia Paternal Grandfather    Hypertension Paternal Grandfather    Kidney disease Paternal Grandfather     Past Surgical History:  Procedure Laterality Date   NO PAST SURGERIES     Social History   Occupational History   Not on file  Tobacco Use   Smoking status: Never   Smokeless tobacco: Never  Vaping Use   Vaping Use: Never used  Substance and Sexual Activity   Alcohol use: No   Drug use: No   Sexual activity: Never

## 2023-04-20 ENCOUNTER — Encounter: Payer: Self-pay | Admitting: Family Medicine

## 2023-04-20 ENCOUNTER — Telehealth (INDEPENDENT_AMBULATORY_CARE_PROVIDER_SITE_OTHER): Payer: PPO | Admitting: Family Medicine

## 2023-04-20 VITALS — Ht 71.0 in | Wt 200.0 lb

## 2023-04-20 DIAGNOSIS — U071 COVID-19: Secondary | ICD-10-CM

## 2023-04-20 MED ORDER — NIRMATRELVIR/RITONAVIR (PAXLOVID)TABLET: 3.0000 | ORAL_TABLET | Freq: Two times a day (BID) | ORAL | 0 refills | Status: AC

## 2023-04-20 NOTE — Progress Notes (Signed)
   Virtual Visit via Video   I connected with patient on 04/20/23 at  9:00 AM EDT by a video enabled telemedicine application and verified that I am speaking with the correct person using two identifiers.  Location patient: Home Location provider: [place] [facility] Persons participating in the virtual visit: Patient, Provider, CMA Sheryle Hail C)  I discussed the limitations of evaluation and management by telemedicine and the availability of in person appointments. The patient expressed understanding and agreed to proceed.  Subjective:   HPI:   COVID- mother and father both COVID + and pt lives at home.  Starting yesterday he developed sore throat, cough, congestion, and body aches.  Denies fever.  Has not tested.  Intermittent SOB.    ROS:   See pertinent positives and negatives per HPI.  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  No Known Allergies  Objective:   Ht 5\' 11"  (1.803 m)   Wt 200 lb (90.7 kg)   BMI 27.89 kg/m  AAOx3, NAD NCAT, EOMI No obvious CN deficits Coloring WNL Pt is able to speak clearly, coherently without shortness of breath or increased work of breathing.  Thought process is linear.  Mood is appropriate.   Assessment and Plan:   COVID- new.  Pt's parents both have COVID and he lives at home w/ them.  His sxs are consistent w/ infection.  So even w/o + test w/ treat w/ Paxlovid based on clinical dx.  Reviewed supportive care and red flags that should prompt return.  Pt expressed understanding and is in agreement w/  plan.    Neena Rhymes, MD 04/20/2023

## 2023-07-23 DIAGNOSIS — H5203 Hypermetropia, bilateral: Secondary | ICD-10-CM | POA: Diagnosis not present

## 2023-07-23 DIAGNOSIS — H52223 Regular astigmatism, bilateral: Secondary | ICD-10-CM | POA: Diagnosis not present

## 2023-07-23 DIAGNOSIS — H52533 Spasm of accommodation, bilateral: Secondary | ICD-10-CM | POA: Diagnosis not present

## 2023-08-05 ENCOUNTER — Ambulatory Visit (INDEPENDENT_AMBULATORY_CARE_PROVIDER_SITE_OTHER): Payer: PPO | Admitting: *Deleted

## 2023-08-05 DIAGNOSIS — Z0000 Encounter for general adult medical examination without abnormal findings: Secondary | ICD-10-CM | POA: Diagnosis not present

## 2023-08-05 NOTE — Progress Notes (Signed)
Subjective:   Richard Marks is a 37 y.o. male who presents for Medicare Annual/Subsequent preventive examination.  Visit Complete: Virtual  I connected with  Richard Marks on 08/05/23 by a audio enabled telemedicine application and verified that I am speaking with the correct person using two identifiers.  Patient Location: Home  Provider Location: Home Office  I discussed the limitations of evaluation and management by telemedicine. The patient expressed understanding and agreed to proceed.  Vital Signs: Unable to obtain new vitals due to this being a telehealth visit.   Cardiac Risk Factors include: advanced age (>80men, >28 women);male gender     Objective:    There were no vitals filed for this visit. There is no height or weight on file to calculate BMI.     08/05/2023    8:13 AM 10/22/2022    9:49 AM 10/26/2019    4:23 PM 05/11/2016    4:15 AM  Advanced Directives  Does Patient Have a Medical Advance Directive? No No No No  Would patient like information on creating a medical advance directive?  No - Patient declined No - Patient declined No - patient declined information    Current Medications (verified) No outpatient encounter medications on file as of 08/05/2023.   No facility-administered encounter medications on file as of 08/05/2023.    Allergies (verified) Patient has no known allergies.   History: Past Medical History:  Diagnosis Date   [name]    Diagnosed at [facility] when patient was 37 years old   Past Surgical History:  Procedure Laterality Date   NO PAST SURGERIES     Family History  Problem Relation Age of Onset   Diabetes Mother    Hypertension Mother    Hyperlipidemia Mother    Depression Mother    Hypertension Father    Diabetes Father    COPD Father    Alcohol abuse Sister    Asthma Sister    Depression Sister    Drug abuse Sister    Hypertension Sister    Hypertension Sister    Depression Sister    Diabetes  Sister    Depression Brother    Diabetes Maternal Grandmother    Heart disease Maternal Grandmother    Hyperlipidemia Maternal Grandmother    Kidney disease Maternal Grandmother    Heart disease Maternal Grandfather    Hypertension Maternal Grandfather    Kidney disease Maternal Grandfather    Arthritis Paternal Grandmother    Diabetes Paternal Grandmother    Heart disease Paternal Grandmother    Hyperlipidemia Paternal Grandmother    Hypertension Paternal Grandmother    Kidney disease Paternal Grandmother    Stroke Paternal Grandmother    Asthma Paternal Grandfather    COPD Paternal Grandfather    Hyperlipidemia Paternal Grandfather    Hypertension Paternal Grandfather    Kidney disease Paternal Grandfather    Social History   Socioeconomic History   Marital status: Single    Spouse name: Not on file   Number of children: Not on file   Years of education: Not on file   Highest education level: Not on file  Occupational History   Not on file  Tobacco Use   Smoking status: Never   Smokeless tobacco: Never  Vaping Use   Vaping status: Never Used  Substance and Sexual Activity   Alcohol use: No   Drug use: No   Sexual activity: Never  Other Topics Concern   Not on file  Social History Narrative   Learning disability   Non smoker   Occasional drinker   High school graduate   Lives at home with mother and 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)   Social Determinants of Corporate investment banker Strain: Low Risk  (08/05/2023)   Overall Financial Resource Strain (CARDIA)    Difficulty of Paying Living Expenses: Not hard at all  Food Insecurity: No Food Insecurity (08/05/2023)   Hunger Vital Sign    Worried About Running Out of Food in the Last Year: Never true    Ran Out of Food in the Last Year: Never true  Transportation Needs: No Transportation Needs (08/05/2023)   PRAPARE - Administrator, Civil Service (Medical): No    Lack of  Transportation (Non-Medical): No  Physical Activity: Inactive (08/05/2023)   Exercise Vital Sign    Days of Exercise per Week: 0 days    Minutes of Exercise per Session: 0 min  Stress: No Stress Concern Present (08/05/2023)   [employer] of Occupational Health - Occupational Stress Questionnaire    Feeling of Stress : Not at all  Social Connections: Socially Isolated (08/05/2023)   Social Connection and Isolation Panel [NHANES]    Frequency of Communication with Friends and Family: More than three times a week    Frequency of Social Gatherings with Friends and Family: Three times a week    Attends Religious Services: Never    Active Member of Clubs or Organizations: No    Attends Engineer, structural: Never    Marital Status: Never married    Tobacco Counseling Counseling given: Not Answered   Clinical Intake:           Diabetes: No  How often do you need to have someone help you when you read instructions, pamphlets, or other written materials from your doctor or pharmacy?: 3 - Sometimes  Interpreter Needed?: No  Information entered by :: Remi Haggard LPN   Activities of Daily Living    08/05/2023    8:06 AM 10/22/2022    9:57 AM  In your present state of health, do you have any difficulty performing the following activities:  Hearing? 0 0  Vision? 0 0  Difficulty concentrating or making decisions? 0 0  Walking or climbing stairs? 0 0  Dressing or bathing? 0 0  Doing errands, shopping? 0 0  Preparing Food and eating ? N N  Using the Toilet? N N  In the past six months, have you accidently leaked urine? N N  Do you have problems with loss of bowel control? N N  Managing your Medications? N N  Managing your Finances? N N  Housekeeping or managing your Housekeeping? N N    Patient Care Team: Sheliah Hatch, MD as PCP - General (Family Medicine)  Indicate any recent Medical Services you may have received from other than [facility] providers in the  past year (date may be approximate).     Assessment:   This is a routine wellness examination for Richard Marks.  Hearing/Vision screen Hearing Screening - Comments:: No trouble hearing Vision Screening - Comments:: Up to date Unsure of name    Goals Addressed             This Visit's Progress    Patient Stated       Continue current lifestyle       Depression Screen    08/05/2023    8:10 AM 04/20/2023  8:53 AM 10/22/2022    9:55 AM 08/07/2022   12:51 PM 09/14/2019    9:04 AM 10/20/2017    9:11 AM 07/20/2013    1:24 PM  PHQ 2/9 Scores  PHQ - 2 Score 0 0 0 3 0 0 0  PHQ- 9 Score 0 0 0 7 0      Fall Risk    08/05/2023    8:05 AM 04/20/2023    8:54 AM 10/22/2022    9:49 AM 08/07/2022   12:51 PM 07/30/2021    8:11 AM  Fall Risk   Falls in the past year? 0 0 0 0 0  Number falls in past yr: 0 0 0  0  Injury with Fall? 0 0 0  0  Risk for fall due to :  No Fall Risks  No Fall Risks No Fall Risks  Follow up Falls evaluation completed;Education provided;Falls prevention discussed Falls evaluation completed Falls evaluation completed;Education provided;Falls prevention discussed Falls evaluation completed Falls evaluation completed    MEDICARE RISK AT HOME: Medicare Risk at Home Any stairs in or around the home?: No If so, are there any without handrails?: No Home free of loose throw rugs in walkways, pet beds, electrical cords, etc?: Yes Adequate lighting in your home to reduce risk of falls?: Yes Life alert?: No Use of a cane, walker or w/c?: No Grab bars in the bathroom?: Yes Shower chair or bench in shower?: No Elevated toilet seat or a handicapped toilet?: No  TIMED UP AND GO:  Was the test performed?  No    Cognitive Function:        08/05/2023    8:07 AM 10/22/2022    9:51 AM  6CIT Screen  What Year? 0 points 0 points  What month? 0 points 0 points  What time? 0 points 0 points  Count back from 20 0 points 0 points  Months in reverse 0 points 4 points  Repeat  phrase 0 points 0 points  Total Score 0 points 4 points    Immunizations Immunization History  Administered Date(s) Administered   Influenza,inj,Quad PF,6+ Mos 07/20/2013, 10/20/2017, 07/30/2021, 08/07/2022    TDAP status: Due, Education has been provided regarding the importance of this vaccine. Advised may receive this vaccine at local pharmacy or Health Dept. Aware to provide a copy of the vaccination record if obtained from local pharmacy or Health Dept. Verbalized acceptance and understanding.  Flu Vaccine status: Due, Education has been provided regarding the importance of this vaccine. Advised may receive this vaccine at local pharmacy or Health Dept. Aware to provide a copy of the vaccination record if obtained from local pharmacy or Health Dept. Verbalized acceptance and understanding.    Covid-19 vaccine status: Information provided on how to obtain vaccines.     Screening Tests Health Maintenance  Topic Date Due   INFLUENZA VACCINE  02/14/2024 (Originally 06/17/2023)   Medicare Annual Wellness (AWV)  08/04/2024   Hepatitis C Screening  Completed   HPV VACCINES  Aged Out   DTaP/Tdap/Td  Discontinued   COVID-19 Vaccine  Discontinued   HIV Screening  Discontinued    Health Maintenance  There are no preventive care reminders to display for this patient.     Lung Cancer Screening: (Low Dose CT Chest recommended if Age 33-80 years, 20 pack-year currently smoking OR have quit w/in 15years.) does not qualify.   Lung Cancer Screening Referral:   Additional Screening:  Hepatitis C Screening: does not qualify; Completed 2022  Vision  Screening: Recommended annual ophthalmology exams for early detection of glaucoma and other disorders of the eye. Is the patient up to date with their annual eye exam?  Yes  Who is the provider or what is the name of the office in which the patient attends annual eye exams? Unsure of name If pt is not established with a provider, would they  like to be referred to a provider to establish care? No .   Dental Screening: Recommended annual dental exams for proper oral hygiene    Community Resource Referral / Chronic Care Management: CRR required this visit?  No   CCM required this visit?  No     Plan:     I have personally reviewed and noted the following in the patient's chart:   Medical and social history Use of alcohol, tobacco or illicit drugs  Current medications and supplements including opioid prescriptions. Patient is not currently taking opioid prescriptions. Functional ability and status Nutritional status Physical activity Advanced directives List of other physicians Hospitalizations, surgeries, and ER visits in previous 12 months Vitals Screenings to include cognitive, depression, and falls Referrals and appointments  In addition, I have reviewed and discussed with patient certain preventive protocols, quality metrics, and best practice recommendations. A written personalized care plan for preventive services as well as general preventive health recommendations were provided to patient.     Remi Haggard, LPN   1/61/0960   After Visit Summary: (MyChart) Due to this being a telephonic visit, the after visit summary with patients personalized plan was offered to patient via MyChart   Nurse Notes:

## 2023-08-05 NOTE — Patient Instructions (Signed)
Mr. Richard Marks , Thank you for taking time to come for your Medicare Wellness Visit. I appreciate your ongoing commitment to your health goals. Please review the following plan we discussed and let me know if I can assist you in the future.   Screening recommendations/referrals:  Recommended yearly ophthalmology/optometry visit for glaucoma screening and checkup Recommended yearly dental visit for hygiene and checkup  Vaccinations: Influenza vaccine: Education provided  Tdap vaccine: Education provided     Preventive Care , Male Preventive care refers to lifestyle choices and visits with your health care provider that can promote health and wellness. What does preventive care include? A yearly physical exam. This is also called an annual well check. Dental exams once or twice a year. Routine eye exams. Ask your health care provider how often you should have your eyes checked. Personal lifestyle choices, including: Daily care of your teeth and gums. Regular physical activity. Eating a healthy diet. Avoiding tobacco and drug use. Limiting alcohol use. Practicing safe sex. Taking low-dose aspirin every day starting at age 68. What happens during an annual well check? The services and screenings done by your health care provider during your annual well check will depend on your age, overall health, lifestyle risk factors, and family history of disease. Counseling  Your health care provider may ask you questions about your: Alcohol use. Tobacco use. Drug use. Emotional well-being. Home and relationship well-being. Sexual activity. Eating habits. Work and work Astronomer. Screening  You may have the following tests or measurements: Height, weight, and BMI. Blood pressure. Lipid and cholesterol levels. These may be checked every 5 years, or more frequently if you are over 64 years old. Skin check. Lung cancer screening. You may have this screening every year starting at age 43 if  you have a 30-pack-year history of smoking and currently smoke or have quit within the past 15 years. Fecal occult blood test (FOBT) of the stool. You may have this test every year starting at age 57. Flexible sigmoidoscopy or colonoscopy. You may have a sigmoidoscopy every 5 years or a colonoscopy every 10 years starting at age 69. Prostate cancer screening. Recommendations will vary depending on your family history and other risks. Hepatitis C blood test. Hepatitis B blood test. Sexually transmitted disease (STD) testing. Diabetes screening. This is done by checking your blood sugar (glucose) after you have not eaten for a while (fasting). You may have this done every 1-3 years. Discuss your test results, treatment options, and if necessary, the need for more tests with your health care provider. Vaccines  Your health care provider may recommend certain vaccines, such as: Influenza vaccine. This is recommended every year. Tetanus, diphtheria, and acellular pertussis (Tdap, Td) vaccine. You may need a Td booster every 10 years. Zoster vaccine. You may need this after age 69. Pneumococcal 13-valent conjugate (PCV13) vaccine. You may need this if you have certain conditions and have not been vaccinated. Pneumococcal polysaccharide (PPSV23) vaccine. You may need one or two doses if you smoke cigarettes or if you have certain conditions. Talk to your health care provider about which screenings and vaccines you need and how often you need them. This information is not intended to replace advice given to you by your health care provider. Make sure you discuss any questions you have with your health care provider. Document Released: 11/29/2015 Document Revised: 07/22/2016 Document Reviewed: 09/03/2015 Elsevier Interactive Patient Education  2017 [employer].  Fall Prevention in the Home Falls can cause injuries. They can  happen to people of all ages. There are many things you can do to make your  home safe and to help prevent falls. What can I do on the outside of my home? Regularly fix the edges of walkways and driveways and fix any cracks. Remove anything that might make you trip as you walk through a door, such as a raised step or threshold. Trim any bushes or trees on the path to your home. Use bright outdoor lighting. Clear any walking paths of anything that might make someone trip, such as rocks or tools. Regularly check to see if handrails are loose or broken. Make sure that both sides of any steps have handrails. Any raised decks and porches should have guardrails on the edges. Have any leaves, snow, or ice cleared regularly. Use sand or salt on walking paths during winter. Clean up any spills in your garage right away. This includes oil or grease spills. What can I do in the bathroom? Use night lights. Install grab bars by the toilet and in the tub and shower. Do not use towel bars as grab bars. Use non-skid mats or decals in the tub or shower. If you need to sit down in the shower, use a plastic, non-slip stool. Keep the floor dry. Clean up any water that spills on the floor as soon as it happens. Remove soap buildup in the tub or shower regularly. Attach bath mats securely with double-sided non-slip rug tape. Do not have throw rugs and other things on the floor that can make you trip. What can I do in the bedroom? Use night lights. Make sure that you have a light by your bed that is easy to reach. Do not use any sheets or blankets that are too big for your bed. They should not hang down onto the floor. Have a firm chair that has side arms. You can use this for support while you get dressed. Do not have throw rugs and other things on the floor that can make you trip. What can I do in the kitchen? Clean up any spills right away. Avoid walking on wet floors. Keep items that you use a lot in easy-to-reach places. If you need to reach something above you, use a strong  step stool that has a grab bar. Keep electrical cords out of the way. Do not use floor polish or wax that makes floors slippery. If you must use wax, use non-skid floor wax. Do not have throw rugs and other things on the floor that can make you trip. What can I do with my stairs? Do not leave any items on the stairs. Make sure that there are handrails on both sides of the stairs and use them. Fix handrails that are broken or loose. Make sure that handrails are as long as the stairways. Check any carpeting to make sure that it is firmly attached to the stairs. Fix any carpet that is loose or worn. Avoid having throw rugs at the top or bottom of the stairs. If you do have throw rugs, attach them to the floor with carpet tape. Make sure that you have a light switch at the top of the stairs and the bottom of the stairs. If you do not have them, ask someone to add them for you. What else can I do to help prevent falls? Wear shoes that: Do not have high heels. Have rubber bottoms. Are comfortable and fit you well. Are closed at the toe. Do not wear sandals.  If you use a stepladder: Make sure that it is fully opened. Do not climb a closed stepladder. Make sure that both sides of the stepladder are locked into place. Ask someone to hold it for you, if possible. Clearly mark and make sure that you can see: Any grab bars or handrails. First and last steps. Where the edge of each step is. Use tools that help you move around (mobility aids) if they are needed. These include: Canes. Walkers. Scooters. Crutches. Turn on the lights when you go into a dark area. Replace any light bulbs as soon as they burn out. Set up your furniture so you have a clear path. Avoid moving your furniture around. If any of your floors are uneven, fix them. If there are any pets around you, be aware of where they are. Review your medicines with your doctor. Some medicines can make you feel dizzy. This can increase your  chance of falling. Ask your doctor what other things that you can do to help prevent falls. This information is not intended to replace advice given to you by your health care provider. Make sure you discuss any questions you have with your health care provider. Document Released: 08/29/2009 Document Revised: 04/09/2016 Document Reviewed: 12/07/2014 Elsevier Interactive Patient Education  2017 [employer].

## 2023-09-13 ENCOUNTER — Telehealth: Payer: Self-pay | Admitting: Family Medicine

## 2023-09-13 NOTE — Telephone Encounter (Signed)
Caller name: Quindon Geckler   On DPR?: Yes  Call back number: [phone]   Provider they see: Sheliah Hatch, MD  Reason for call: Mother called stating son's BP ws 147/107 and heart was racing sent to triage.

## 2023-09-13 NOTE — Telephone Encounter (Signed)
Patient Name First: Richard Last: Marks Gender: Male DOB: Jul 24, 1986 Age: 37 Y 2 M 15 D Return Phone Number: [phone] (Primary) Address: City/ State/ Zip: [place]  [address] Client [facility] [place] [facility] [place] Client Site [facility] - Day Provider Lezlie Octave- MD Contact Type Call Who Is Calling Patient / Member / Family / Caregiver Call Type Triage / Clinical Caller Name Ginger Castonguay Relationship To Patient Mother Return Phone Number [phone] (Primary) Chief Complaint Blood Pressure High Reason for Call Symptomatic / Request for Health Information Initial Comment Caller states her son woke up yesterday with a headache in the back of his head. Pt bp is 147/107. Translation No Nurse Assessment Nurse: Henri Medal, RN, Amy Date/Time (Eastern Time): 09/13/2023 8:41:39 AM Confirm and document reason for call. If symptomatic, describe symptoms. ---Caller states her son's BP is 147/107 & HR 70 this morning. It was higher yesterday, 148/110, 161/113, 158/102. He had a HA yesterday in the back of the head, but it went away. He does not take BP meds. He states he sometimes gets SOB & sometimes his heart seems to be racing at rest. He gets heartburn too. He had SOB when he woke up 45 min. ago but it now right now. He does feel like his heart is racing. Does the patient have any new or worsening symptoms? ---Yes Will a triage be completed? ---Yes Related visit to physician within the last 2 weeks? ---No Does the PT have any chronic conditions? (i.e. diabetes, asthma, this includes High risk factors for pregnancy, etc.) ---No Is this a behavioral health or substance abuse call? ---No Guidelines Guideline Title Affirmed Question Affirmed Notes Nurse Date/Time (Eastern Time) Blood Pressure - High Systolic BP >= 160 OR Diastolic >= 100 Lovelace, RN, Amy 09/13/2023 8:42:28 AM PLEASE NOTE: All timestamps  contained within this report are represented as [place] Standard Time. CONFIDENTIALTY NOTICE: This fax transmission is intended only for the addressee. It contains information that is legally privileged, confidential or otherwise protected from use or disclosure. If you are not the intended recipient, you are strictly prohibited from reviewing, disclosing, copying using or disseminating any of this information or taking any action in reliance on or regarding this information. If you have received this fax in error, please notify us immediately by telephone so that we can arrange for its return to [place]. Phone: [phone], Toll-Free: [phone], Fax: [phone] Page: 2 of 2 Call Id: [id number] Disp. Time Lamount Cohen Time) Disposition Final User 09/13/2023 8:46:32 AM SEE PCP WITHIN 3 DAYS Yes Lovelace, RN, Amy Final Disposition 09/13/2023 8:46:32 AM SEE PCP WITHIN 3 DAYS Yes Lovelace, RN, Amy Caller Disagree/Comply Comply Caller Understands Yes PreDisposition InappropriateToAsk Care Advice Given Per Guideline SEE PCP WITHIN 3 DAYS: * You need to be seen within 2 or 3 days. * PCP VISIT: Call your doctor (or NP/PA) during regular office hours and make an appointment. A clinic or urgent care center are good places to go for care if your doctor's office is closed or you can't get an appointment. NOTE: If office will be open tomorrow, tell caller to call then, not in 3 days. CALL BACK IF: * Difficulty walking, difficulty talking, or severe headache occurs * Chest pain or difficulty breathing occurs * Your blood pressure is over 180/110 * You become worse CARE ADVICE given per Blood Pressure - High (Adult) guideline

## 2023-09-13 NOTE — Telephone Encounter (Signed)
Pt was advised by triage to see PCP within 2-3 days. Pt's mom insisted on scheduling an appt on Friday.

## 2023-09-14 ENCOUNTER — Telehealth: Payer: Self-pay | Admitting: Family Medicine

## 2023-09-17 ENCOUNTER — Other Ambulatory Visit: Payer: PPO

## 2023-09-17 ENCOUNTER — Encounter: Payer: Self-pay | Admitting: Family Medicine

## 2023-09-17 ENCOUNTER — Ambulatory Visit: Payer: PPO | Admitting: Family Medicine

## 2023-09-17 VITALS — BP 144/100 | HR 75 | Temp 98.1°F | Ht 71.0 in | Wt 230.1 lb

## 2023-09-17 DIAGNOSIS — Z0000 Encounter for general adult medical examination without abnormal findings: Secondary | ICD-10-CM

## 2023-09-17 DIAGNOSIS — I10 Essential (primary) hypertension: Secondary | ICD-10-CM

## 2023-09-17 DIAGNOSIS — R519 Headache, unspecified: Secondary | ICD-10-CM

## 2023-09-17 DIAGNOSIS — R002 Palpitations: Secondary | ICD-10-CM | POA: Diagnosis not present

## 2023-09-17 LAB — BASIC METABOLIC PANEL
BUN: 13 mg/dL (ref 6–23)
CO2: 29 meq/L (ref 19–32)
Calcium: 9.5 mg/dL (ref 8.4–10.5)
Chloride: 101 meq/L (ref 96–112)
Creatinine, Ser: 0.99 mg/dL (ref 0.40–1.50)
GFR: 97.51 mL/min (ref >60.00)
Glucose, Bld: 83 mg/dL (ref 70–99)
Potassium: 3.8 meq/L (ref 3.5–5.1)
Sodium: 139 meq/L (ref 135–145)

## 2023-09-17 LAB — CBC WITH DIFFERENTIAL/PLATELET
Basophils Absolute: 0 10*3/uL (ref 0.0–0.1)
Basophils Relative: 0.4 % (ref 0.0–3.0)
Eosinophils Absolute: 0.1 10*3/uL (ref 0.0–0.7)
Eosinophils Relative: 1.3 % (ref 0.0–5.0)
HCT: 48.8 % (ref 39.0–52.0)
Hemoglobin: 16.4 g/dL (ref 13.0–17.0)
Lymphocytes Relative: 28.8 % (ref 12.0–46.0)
Lymphs Abs: 1.9 10*3/uL (ref 0.7–4.0)
MCHC: 33.6 g/dL (ref 30.0–36.0)
MCV: 97.4 fL (ref 78.0–100.0)
Monocytes Absolute: 0.5 10*3/uL (ref 0.1–1.0)
Monocytes Relative: 7.6 % (ref 3.0–12.0)
Neutro Abs: 4 10*3/uL (ref 1.4–7.7)
Neutrophils Relative %: 61.9 % (ref 43.0–77.0)
Platelets: 251 10*3/uL (ref 150.0–400.0)
RBC: 5.01 Mil/uL (ref 4.22–5.81)
RDW: 13.4 % (ref 11.5–15.5)
WBC: 6.5 10*3/uL (ref 4.0–10.5)

## 2023-09-17 LAB — HEPATIC FUNCTION PANEL
ALT: 39 U/L (ref 0–53)
AST: 28 U/L (ref 0–37)
Albumin: 4.7 g/dL (ref 3.5–5.2)
Alkaline Phosphatase: 65 U/L (ref 39–117)
Bilirubin, Direct: 0.1 mg/dL (ref 0.0–0.3)
Total Bilirubin: 0.5 mg/dL (ref 0.2–1.2)
Total Protein: 7.7 g/dL (ref 6.0–8.3)

## 2023-09-17 LAB — LIPID PANEL
Cholesterol: 247 mg/dL — ABNORMAL HIGH (ref 0–200)
HDL: 30.8 mg/dL — ABNORMAL LOW (ref >39.00)
LDL Cholesterol: 148 mg/dL — ABNORMAL HIGH (ref 0–99)
NonHDL: 216.58
Total CHOL/HDL Ratio: 8
Triglycerides: 342 mg/dL — ABNORMAL HIGH (ref 0.0–149.0)
VLDL: 68.4 mg/dL — ABNORMAL HIGH (ref 0.0–40.0)

## 2023-09-17 LAB — TSH: TSH: 1.1 u[IU]/mL (ref 0.35–5.50)

## 2023-09-17 MED ORDER — VALSARTAN 80 MG PO TABS: 80.0000 mg | ORAL_TABLET | Freq: Every day | ORAL | 3 refills | Status: DC

## 2023-09-17 NOTE — Progress Notes (Signed)
   Subjective:    Patient ID: Richard Marks, male    DOB: Nov 11, 1986, 37 y.o.   MRN: [id number]  HPI Elevated BP- sxs started Saturday night w/ a 'bad headache in the back of his head'.  Had some associated dizziness, SOB.  Sunday morning developed 'real bad indigestion'.  Pt reports he had been having blurry vision and palpitations all week.  Denies recent stressors.  No change in caffeine level.  Drinking good amount of water.  No change in diet.  Woke this morning w/ palpitations- had associated SOB.  At times palpitations and SOB will overlap but other times are separate.  Had blurry vision last night.  Home BP's are running 135-161/93-113.  + family hx of HTN.   Review of Systems For ROS see HPI     Objective:   Physical Exam Vitals reviewed.  Constitutional:      General: He is not in acute distress.    Appearance: He is well-developed.  HENT:     Head: Normocephalic and atraumatic.  Eyes:     Extraocular Movements: Extraocular movements intact.     Conjunctiva/sclera: Conjunctivae normal.     Pupils: Pupils are equal, round, and reactive to light.  Neck:     Thyroid: No thyromegaly.  Cardiovascular:     Rate and Rhythm: Normal rate and regular rhythm.     Pulses: Normal pulses.     Heart sounds: Normal heart sounds. No murmur heard. Pulmonary:     Effort: Pulmonary effort is normal. No respiratory distress.     Breath sounds: Normal breath sounds.  Abdominal:     General: Bowel sounds are normal. There is no distension.     Palpations: Abdomen is soft.  Musculoskeletal:     Cervical back: Normal range of motion and neck supple.     Right lower leg: No edema.     Left lower leg: No edema.  Lymphadenopathy:     Cervical: No cervical adenopathy.  Skin:    General: Skin is warm and dry.  Neurological:     General: No focal deficit present.     Mental Status: He is alert and oriented to person, place, and time.     Cranial Nerves: No cranial nerve deficit.   Psychiatric:        Mood and Affect: Mood normal.        Behavior: Behavior normal.           Assessment & Plan:  Palpitations- new.  EKG w/o evidence of arrhythmia.  Currently asymptomatic.  Will check labs to r/o metabolic cause.  Admits that when he feels SOB or otherwise symptomatic, will become very anxious which may cause his palpitations.  Will follow closely and if sxs persist will get monitor or refer to Cards.  Pt expressed understanding and is in agreement w/ plan.   Occipital HA- new.  Unclear if this is stress related, BP related, or other cause.  No neuro deficits on exam suggestive of bleed or aneurysm.  Encouraged Tylenol prn, good water intake, improved posture and if no improvement will need Neuro referral.  Reviewed supportive care and red flags that should prompt return.  Pt expressed understanding and is in agreement w/ plan.

## 2023-09-17 NOTE — Patient Instructions (Addendum)
Follow up in 3-4 weeks to recheck blood pressure We'll notify you of your lab results and make any changes if needed START the Valsartan 80mg  daily (it may seem like a high dose, but it's not) Make sure you are drinking plenty of water and limiting your salt intake to improve your blood pressure If you develop any chest pain/pressure, worsening shortness of breath, horrible headache, or any other concerns- PLEASE go to the ER Call with any questions or concerns Stay Safe!  Stay Healthy! Hang in there!

## 2023-09-18 LAB — CREATININE KINASE MB: CK-MB Index: 1.8 ng/mL (ref 0.0–10.4)

## 2023-09-18 LAB — TROPONIN I: Troponin I: <3 ng/L (ref <=47)

## 2023-09-20 ENCOUNTER — Telehealth: Payer: Self-pay

## 2023-09-20 ENCOUNTER — Other Ambulatory Visit: Payer: Self-pay

## 2023-09-20 DIAGNOSIS — E785 Hyperlipidemia, unspecified: Secondary | ICD-10-CM

## 2023-09-20 MED ORDER — ROSUVASTATIN CALCIUM 20 MG PO TABS
20.0000 mg | ORAL_TABLET | Freq: Every day | ORAL | 3 refills | Status: DC
Start: 2023-09-20 — End: 2024-04-20

## 2023-09-20 NOTE — Telephone Encounter (Signed)
-----   Message from Neena Rhymes sent at 09/20/2023  7:41 AM EST ----- No evidence of heart attack or heart damage on the labs- great news!  Your total cholesterol, LDL (bad cholesterol), and triglycerides (fatty part of blood) have all jumped considerably since last year.  Based on this, we need to start Crestor 20mg  daily (#30, 3 refills) to bring these numbers back in range and decrease your risk of heart attack or stroke.  Remainder of labs look good

## 2023-10-08 ENCOUNTER — Encounter: Payer: Self-pay | Admitting: Family Medicine

## 2023-10-08 ENCOUNTER — Telehealth: Payer: Self-pay

## 2023-10-08 ENCOUNTER — Ambulatory Visit (INDEPENDENT_AMBULATORY_CARE_PROVIDER_SITE_OTHER): Payer: PPO | Admitting: Family Medicine

## 2023-10-08 VITALS — BP 130/78 | HR 71 | Temp 97.8°F | Ht 71.0 in | Wt 221.2 lb

## 2023-10-08 DIAGNOSIS — R0609 Other forms of dyspnea: Secondary | ICD-10-CM | POA: Insufficient documentation

## 2023-10-08 DIAGNOSIS — R0602 Shortness of breath: Secondary | ICD-10-CM | POA: Insufficient documentation

## 2023-10-08 DIAGNOSIS — K219 Gastro-esophageal reflux disease without esophagitis: Secondary | ICD-10-CM | POA: Diagnosis not present

## 2023-10-08 DIAGNOSIS — I10 Essential (primary) hypertension: Secondary | ICD-10-CM | POA: Diagnosis not present

## 2023-10-08 LAB — BASIC METABOLIC PANEL
BUN: 14 mg/dL (ref 6–23)
CO2: 29 meq/L (ref 19–32)
Calcium: 9.4 mg/dL (ref 8.4–10.5)
Chloride: 104 meq/L (ref 96–112)
Creatinine, Ser: 1.03 mg/dL (ref 0.40–1.50)
GFR: 92.95 mL/min (ref >60.00)
Glucose, Bld: 92 mg/dL (ref 70–99)
Potassium: 4.3 meq/L (ref 3.5–5.1)
Sodium: 139 meq/L (ref 135–145)

## 2023-10-08 MED ORDER — OMEPRAZOLE 40 MG PO CPDR: 40.0000 mg | DELAYED_RELEASE_CAPSULE | Freq: Every day | ORAL | 3 refills | Status: DC

## 2023-10-08 NOTE — Assessment & Plan Note (Signed)
New.  Pt reports SOB is occurring w/ minimal exertion.  He denies wheezing.  No hx of smoking or vaping.  States he is SOB '60-70% of the time'.  He isn't sure if he is having nocturnal sxs as he states he is a sound sleeper.  Will refer to pulmonary for complete evaluation.  Pt expressed understanding and is in agreement w/ plan.

## 2023-10-08 NOTE — Progress Notes (Signed)
   Subjective:    Patient ID: Richard Marks, male    DOB: [birth year], 37 y.o.   MRN: [id number]  HPI HTN- new dx at last visit.  Started on Valsartan.  BP is much better today.  No CP since BP improved.  Still has some SOB w/ minimal exertion.  Will still have occasional palpitations.  HA's have improved.  SOB- pt reports SOB w/ minimal exertion.  Reports he can get SOB going from couch to kitchen or house to truck.  Never smoker, no vaping.  Denies wheezing.  Pt reports feeling SOB '60-70% of the time'.    GERD- pt reports sxs are severe despite trying to eat better.  Not currently taking any medication for reflux.  Sxs tend to occur a few hours after eating.     Review of Systems For ROS see HPI     Objective:   Physical Exam Vitals reviewed.  Constitutional:      General: He is not in acute distress.    Appearance: Normal appearance. He is well-developed. He is not ill-appearing.  HENT:     Head: Normocephalic and atraumatic.  Eyes:     Extraocular Movements: Extraocular movements intact.     Conjunctiva/sclera: Conjunctivae normal.     Pupils: Pupils are equal, round, and reactive to light.  Neck:     Thyroid: No thyromegaly.  Cardiovascular:     Rate and Rhythm: Normal rate and regular rhythm.     Pulses: Normal pulses.     Heart sounds: Normal heart sounds. No murmur heard. Pulmonary:     Effort: Pulmonary effort is normal. No respiratory distress.     Breath sounds: Normal breath sounds.  Abdominal:     General: Bowel sounds are normal. There is no distension.     Palpations: Abdomen is soft.     Tenderness: There is no guarding or rebound.  Musculoskeletal:     Cervical back: Normal range of motion and neck supple.     Right lower leg: No edema.     Left lower leg: No edema.  Lymphadenopathy:     Cervical: No cervical adenopathy.  Skin:    General: Skin is warm and dry.  Neurological:     General: No focal deficit present.     Mental Status: He is alert and  oriented to person, place, and time.     Cranial Nerves: No cranial nerve deficit.  Psychiatric:        Mood and Affect: Mood normal.        Behavior: Behavior normal.           Assessment & Plan:

## 2023-10-08 NOTE — Assessment & Plan Note (Signed)
Started on Valsartan at last visit and BP is much better today.  No CP, HA's have improved.  Continues to have occasional palpitations but much less.  He continues to have SOB w/ minimal exertion- see below.  Repeat BMP w/ addition of ARB but no anticipated med changes.

## 2023-10-08 NOTE — Telephone Encounter (Signed)
-----   Message from Neena Rhymes sent at 10/08/2023 12:45 PM EST ----- Labs look great!  No med changes at this time

## 2023-10-08 NOTE — Patient Instructions (Signed)
Follow up in 1 month to recheck heartburn We'll notify you of your lab results and make any changes if needed CONTINUE your current medication ADD the Omeprazole to help w/ reflux We'll call you to schedule your Pulmonary appt for the shortness of breath Call with any questions or concerns Stay Safe!  Stay Healthy! Happy Thanksgiving!!

## 2023-10-08 NOTE — Assessment & Plan Note (Signed)
New.  Pt reports he is having severe reflux sxs despite changing his diet for the better.  Her reports sxs will occur a few hours after eating and 'just hit me'.  Will start daily PPI and monitor for symptom improvement.  Pt expressed understanding and is in agreement w/ plan.

## 2023-10-08 NOTE — Telephone Encounter (Signed)
Pts mom has been notified

## 2023-10-16 NOTE — Assessment & Plan Note (Signed)
New.  Pt's BP is elevated today in the hypertensive range and he is symptomatic w/ HA, dizziness, SOB.  Based on this, will start Valsartan 80mg  daily and will monitor closely.  + family hx of HTN.  Check labs to risk stratify and also assess for elevated trop or CKMB given report of 'real bad indigestion'.  Reviewed supportive care and red flags that should prompt return.  Pt expressed understanding and is in agreement w/ plan.

## 2023-11-08 ENCOUNTER — Ambulatory Visit: Payer: PPO | Admitting: Family Medicine

## 2023-11-12 NOTE — Telephone Encounter (Signed)
error 

## 2023-11-26 IMAGING — MR MR LUMBAR SPINE W/O CM
4 of 6 series · 20 of 48 positions shown · non-contrast
Comparison: 06/29/2020

CLINICAL DATA: Low back pain, left hip pain

EXAM:
MRI LUMBAR SPINE WITHOUT CONTRAST
TECHNIQUE: Multiplanar, multisequence MR imaging of the lumbar spine was
performed. No intravenous contrast was administered.

[Series 6: T2 · sagittal · 4.0mm · 0.73mm/px · 4 of 15 slices shown (1 of 2)]
[im 1/15]
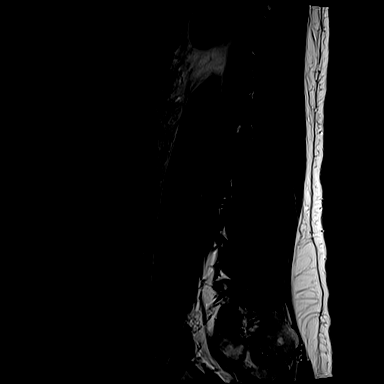
[im 5/15]
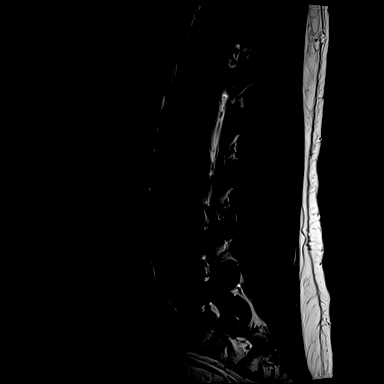
[im 10/15]
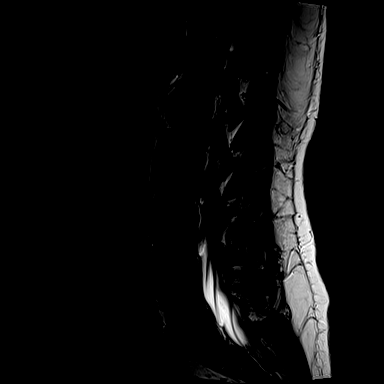
[im 15/15]
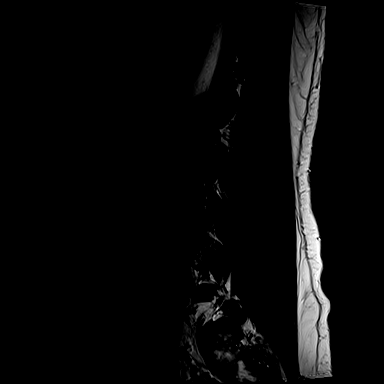

[Series 7: T1 · sagittal · 4.0mm · 0.73mm/px · 4 of 15 slices shown (1 of 2)]
[im 1/15]
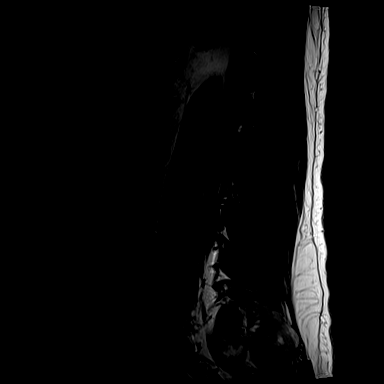
[im 5/15]
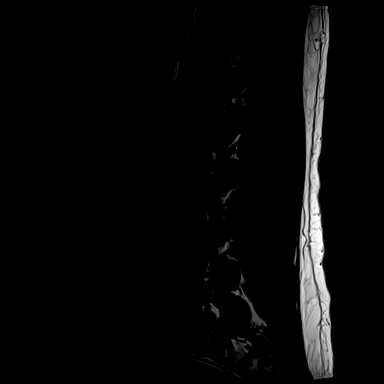
[im 10/15]
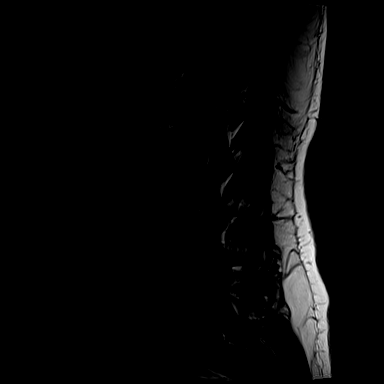
[im 15/15]
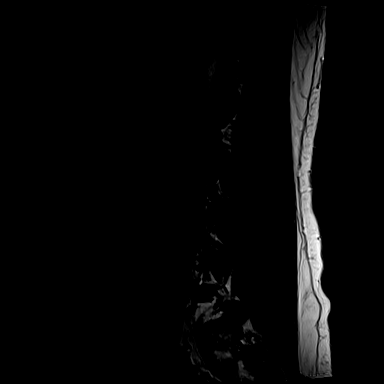

[Series 11: T1 · axial · 4.0mm · 0.28mm/px · z∈[-58,+132]mm · 3 of 45 slices shown (2 of 2)]
[im 9/45]
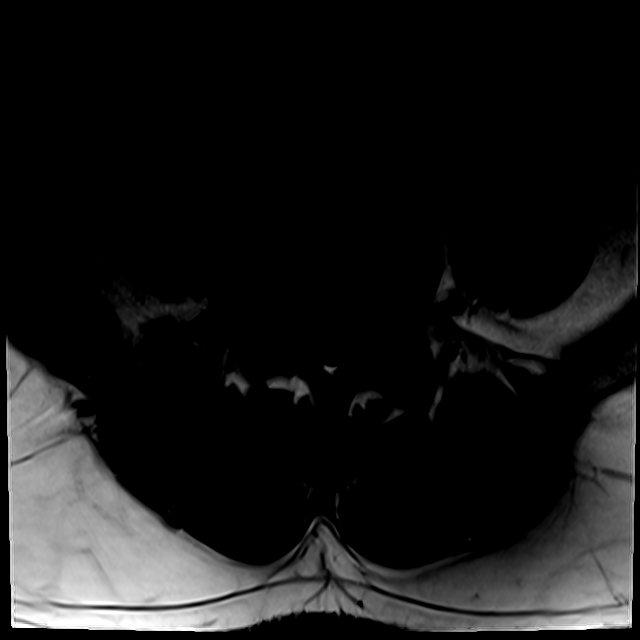
[im 25/45]
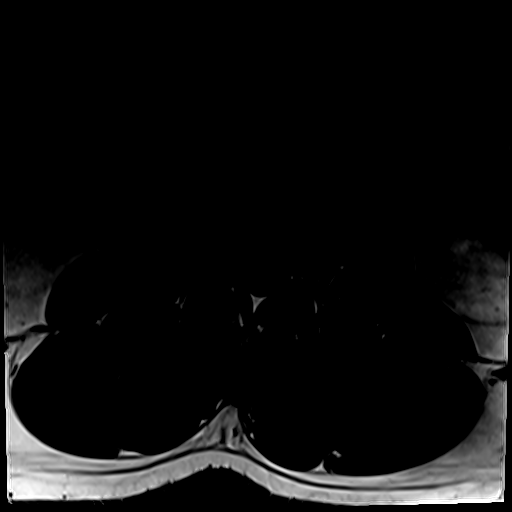
[im 41/45]
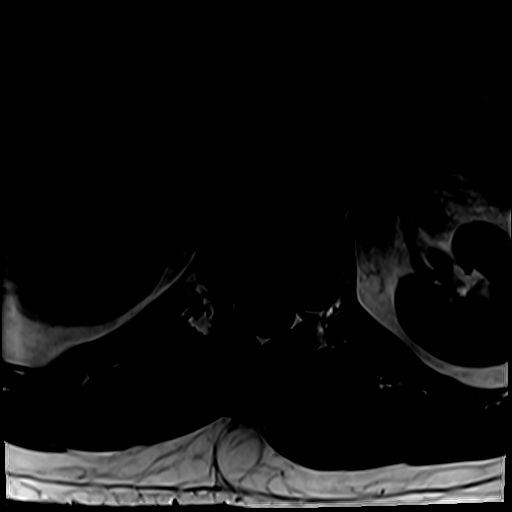

[Series 14: T2 · axial · 4.0mm · 0.28mm/px · z∈[-97,+152]mm · 9 of 45 slices shown (2 of 2)]
[im 1/45]
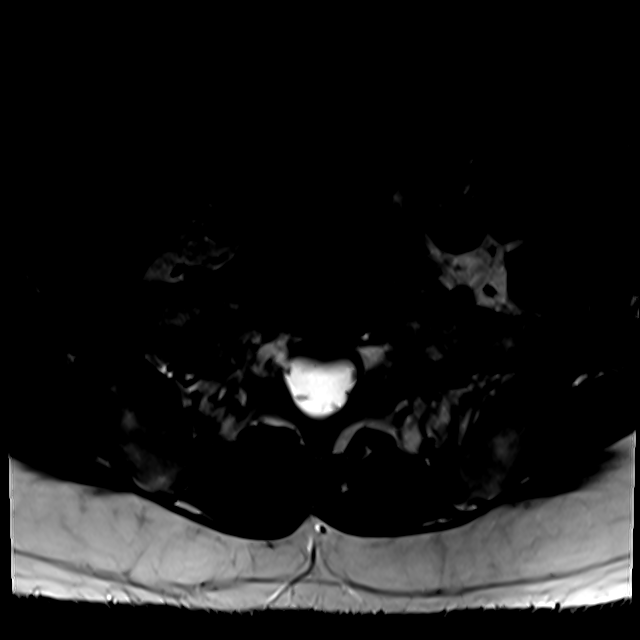
[im 9/45]
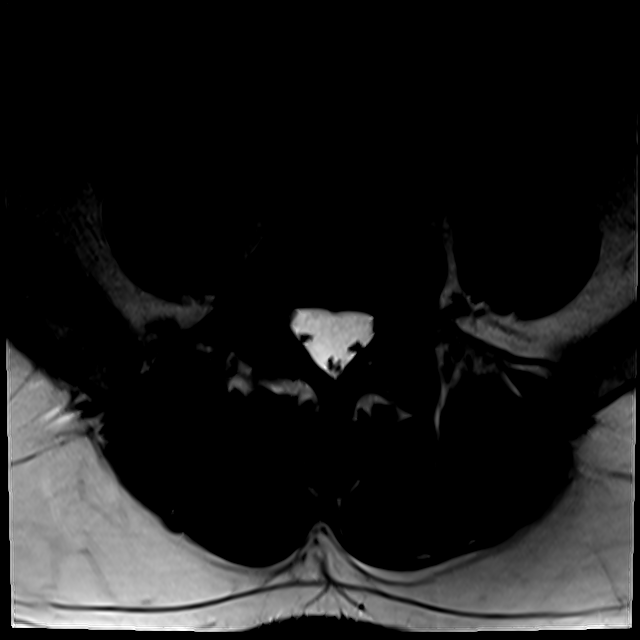
[im 13/45]
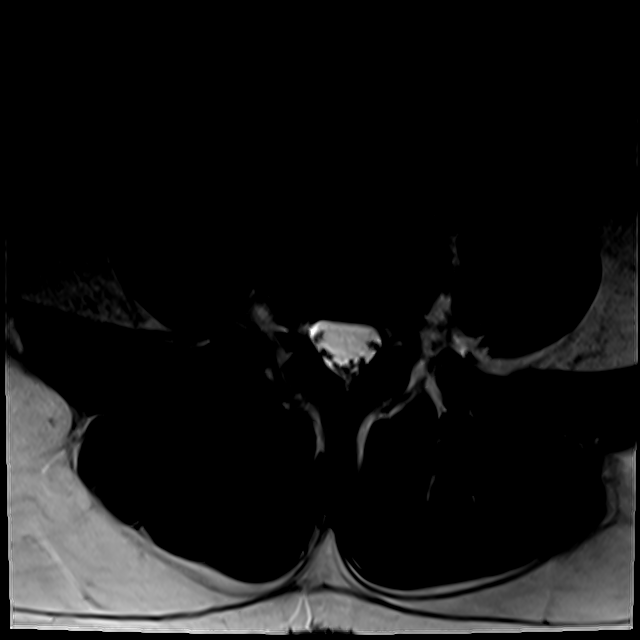
[im 21/45]
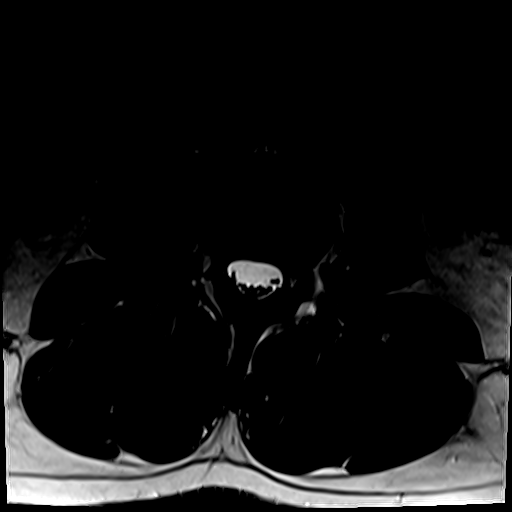
[im 25/45]
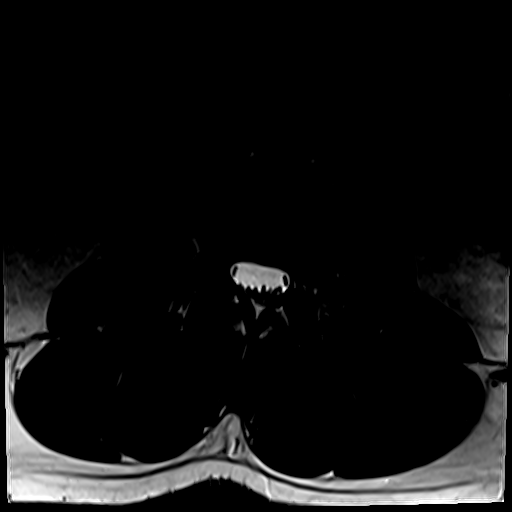
[im 33/45]
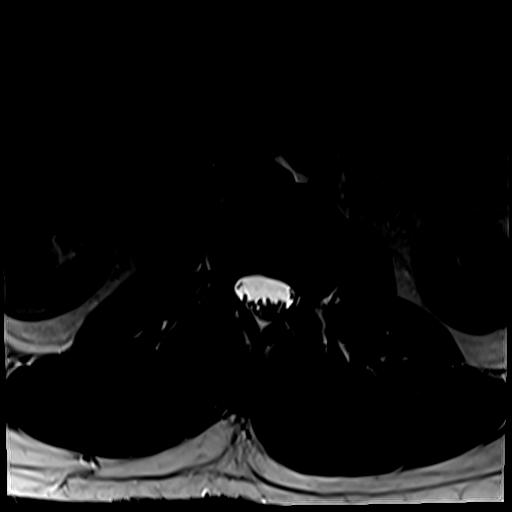
[im 37/45]
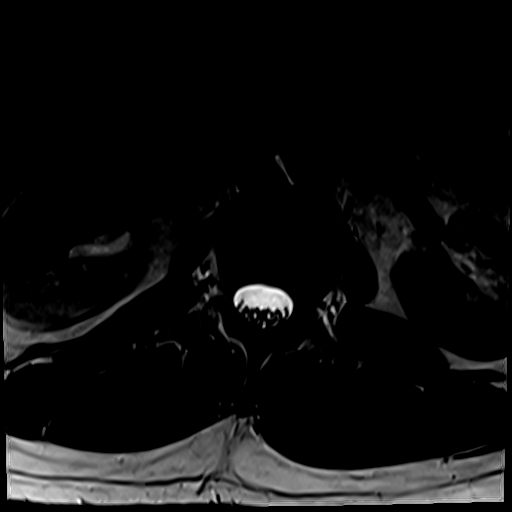
[im 41/45]
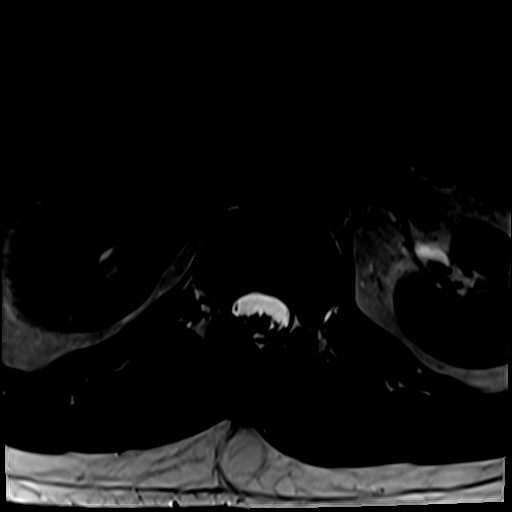
[im 45/45]
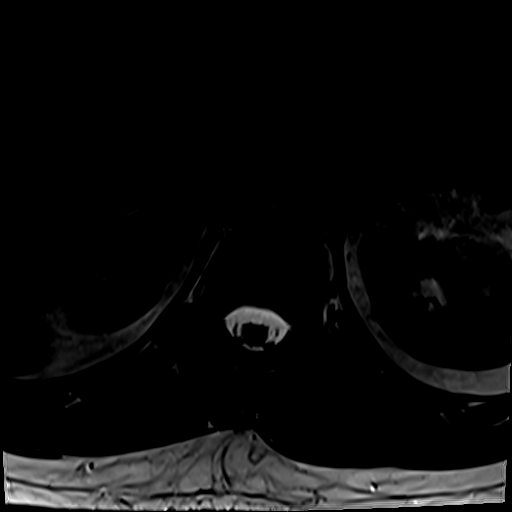

[20 of 48 positions shown; findings below may reference images not displayed]

FINDINGS: Segmentation:  Standard.

Alignment:  Mild levocurvature.  No listhesis.

Vertebrae: No acute fracture or suspicious osseous lesion. Endplate
degenerative changes at the left-greater-than-right aspect of L5-S1.

Conus medullaris and cauda equina: Conus extends to the L1 level.
Conus and cauda equina appear normal.

Paraspinal and other soft tissues: Negative.

Disc levels:

T12-L1: No significant disc bulge. No spinal canal stenosis or
neural foraminal narrowing.

L1-L2: No significant disc bulge. No spinal canal stenosis or neural
foraminal narrowing.

L2-L3: No significant disc bulge. No spinal canal stenosis or neural
foraminal narrowing.

L3-L4: No significant disc bulge. No spinal canal stenosis or neural
foraminal narrowing.

L4-L5: No significant disc bulge. No spinal canal stenosis or neural
foraminal narrowing.

L5-S1: Disc desiccation and mild disc bulge. No spinal canal
stenosis or neural foraminal narrowing.
IMPRESSION: No spinal canal stenosis or neural foraminal narrowing.

## 2023-12-09 ENCOUNTER — Ambulatory Visit: Payer: Self-pay | Admitting: Family Medicine

## 2023-12-09 NOTE — Telephone Encounter (Signed)
FYI patient was told to go to Urgent Care

## 2023-12-09 NOTE — Telephone Encounter (Signed)
Copied from CRM [id number]. Topic: Clinical - Red Word Triage >> Dec 09, 2023  8:12 AM Truddie Crumble wrote: Red Word that prompted transfer to Nurse Triage: shortness of breath off and on  Chief Complaint: SOB at times Symptoms: SOB that comes and goes Frequency: started in Nov 2024 and getting worse Pertinent Negatives: Patient denies wheezing Disposition: [] ED /[x] Urgent Care (no appt availability in office) / [] Appointment(In office/virtual)/ []  [facility]/ [] Home Care/ [] Refused Recommended Disposition /[]  Mobile Bus/ []  Follow-up with PCP Additional Notes: attempted to make patient an appointment and patient wanted Friday appt: none available. Then patient wanted appt next Thursday - I explained to patient due to SOB at times I did not feel comfortable waiting until next Thursday to schedule appointment - recommended patient to go to urgent care or ED: patient's Mom got on the phone and stated patient would go to urgent care for treatment.  Reason for Disposition  [1] MODERATE longstanding difficulty breathing (e.g., speaks in phrases, SOB even at rest, pulse 100-120) AND [2] SAME as normal    Recommended patient go to urgent care or ED  Answer Assessment - Initial Assessment Questions 1. RESPIRATORY STATUS: "Describe your breathing?" (e.g., wheezing, shortness of breath, unable to speak, severe coughing)      SOB 2. ONSET: "When did this breathing problem begin?"      Started Nov 2024 and is getting worse 3. PATTERN "Does the difficult breathing come and go, or has it been constant since it started?"      Comes and goes and none now 4. SEVERITY: "How bad is your breathing?" (e.g., mild, moderate, severe)    - MILD: No SOB at rest, mild SOB with walking, speaks normally in sentences, can lie down, no retractions, pulse < 100.    - MODERATE: SOB at rest, SOB with minimal exertion and prefers to sit, cannot lie down flat, speaks in phrases, mild retractions, audible  wheezing, pulse 100-120.    - SEVERE: Very SOB at rest, speaks in single words, struggling to breathe, sitting hunched forward, retractions, pulse > 120      mild 5. RECURRENT SYMPTOM: "Have you had difficulty breathing before?" If Yes, ask: "When was the last time?" and "What happened that time?"      yes 6. CARDIAC HISTORY: "Do you have any history of heart disease?" (e.g., heart attack, angina, bypass surgery, angioplasty)      no 7. LUNG HISTORY: "Do you have any history of lung disease?"  (e.g., pulmonary embolus, asthma, emphysema)     no 8. CAUSE: "What do you think is causing the breathing problem?"      unknown 9. OTHER SYMPTOMS: "Do you have any other symptoms? (e.g., dizziness, runny nose, cough, chest pain, fever)     no 10. O2 SATURATION MONITOR:  "Do you use an oxygen saturation monitor (pulse oximeter) at home?" If Yes, ask: "What is your reading (oxygen level) today?" "What is your usual oxygen saturation reading?" (e.g., 95%)       N/a  11. PREGNANCY: "Is there any chance you are pregnant?" "When was your last menstrual period?"       N/a 12. TRAVEL: "Have you traveled out of the country in the last month?" (e.g., travel history, exposures)       N/a  Protocols used: Breathing Difficulty-A-AH

## 2023-12-09 NOTE — Telephone Encounter (Signed)
Was pt offered appt at other office or just [facility]?

## 2023-12-10 ENCOUNTER — Emergency Department (HOSPITAL_BASED_OUTPATIENT_CLINIC_OR_DEPARTMENT_OTHER): Payer: PPO | Admitting: Radiology

## 2023-12-10 ENCOUNTER — Emergency Department (HOSPITAL_BASED_OUTPATIENT_CLINIC_OR_DEPARTMENT_OTHER)
Admission: EM | Admit: 2023-12-10 | Discharge: 2023-12-10 | Disposition: A | Discharge status: Home / Self Care | Payer: PPO | Attending: Emergency Medicine | Admitting: Emergency Medicine

## 2023-12-10 ENCOUNTER — Other Ambulatory Visit (HOSPITAL_BASED_OUTPATIENT_CLINIC_OR_DEPARTMENT_OTHER): Payer: Self-pay

## 2023-12-10 ENCOUNTER — Other Ambulatory Visit: Payer: Self-pay

## 2023-12-10 ENCOUNTER — Encounter (HOSPITAL_BASED_OUTPATIENT_CLINIC_OR_DEPARTMENT_OTHER): Payer: Self-pay | Admitting: Emergency Medicine

## 2023-12-10 DIAGNOSIS — I10 Essential (primary) hypertension: Secondary | ICD-10-CM | POA: Insufficient documentation

## 2023-12-10 DIAGNOSIS — R0602 Shortness of breath: Secondary | ICD-10-CM | POA: Diagnosis not present

## 2023-12-10 DIAGNOSIS — R0789 Other chest pain: Secondary | ICD-10-CM | POA: Insufficient documentation

## 2023-12-10 DIAGNOSIS — R0609 Other forms of dyspnea: Secondary | ICD-10-CM

## 2023-12-10 DIAGNOSIS — R0600 Dyspnea, unspecified: Secondary | ICD-10-CM | POA: Diagnosis not present

## 2023-12-10 LAB — CBC
HCT: 42.9 % (ref 39.0–52.0)
Hemoglobin: 15 g/dL (ref 13.0–17.0)
MCH: 33.2 pg (ref 26.0–34.0)
MCHC: 35 g/dL (ref 30.0–36.0)
MCV: 94.9 fL (ref 80.0–100.0)
Platelets: 182 10*3/uL (ref 150–400)
RBC: 4.52 MIL/uL (ref 4.22–5.81)
RDW: 13.3 % (ref 11.5–15.5)
WBC: 6.3 10*3/uL (ref 4.0–10.5)
nRBC: 0 % (ref 0.0–0.2)

## 2023-12-10 LAB — TROPONIN I (HIGH SENSITIVITY)
Troponin I (High Sensitivity): <2 ng/L (ref <18)
Troponin I (High Sensitivity): <2 ng/L (ref <18)

## 2023-12-10 LAB — BASIC METABOLIC PANEL
Anion gap: 7 (ref 5–15)
BUN: 12 mg/dL (ref 6–20)
CO2: 26 mmol/L (ref 22–32)
Calcium: 8.7 mg/dL — ABNORMAL LOW (ref 8.9–10.3)
Chloride: 106 mmol/L (ref 98–111)
Creatinine, Ser: 0.9 mg/dL (ref 0.61–1.24)
GFR, Estimated: >60 mL/min (ref >60)
Glucose, Bld: 90 mg/dL (ref 70–99)
Potassium: 3.7 mmol/L (ref 3.5–5.1)
Sodium: 139 mmol/L (ref 135–145)

## 2023-12-10 LAB — D-DIMER, QUANTITATIVE: D-Dimer, Quant: <0.27 ug{FEU}/mL (ref 0.00–0.50)

## 2023-12-10 MED ORDER — ASPIRIN 81 MG PO CHEW
324.0000 mg | CHEWABLE_TABLET | Freq: Once | ORAL | Status: AC
  Administered 2023-12-10: 324 mg via ORAL
  Filled 2023-12-10: qty 4

## 2023-12-10 NOTE — ED Notes (Signed)
Patient transported to X-ray

## 2023-12-10 NOTE — ED Notes (Signed)
Discharge instructions and follow up care with cardiology reviewed and explained, pt verbalized understanding with no further questions on d/c. Pt caox4, ambulatory, NAD on d/c.

## 2023-12-10 NOTE — ED Triage Notes (Signed)
Symptoms started in November, Intermittent sob. Call for PCP appt and told he needed to be seen for SOB before making appt

## 2023-12-10 NOTE — ED Provider Notes (Signed)
[place] EMERGENCY DEPARTMENT AT [facility] Provider Note   CSN: [id number] Arrival date & time: 12/10/23  1128     History  Chief Complaint  Patient presents with   Shortness of Breath    Richard Marks is a 38 y.o. male.   Shortness of Breath    Patient has a history of [name] acid reflux, hypercholesterolemia and hypertension.  Patient presents to the ED for evaluation of chest pressure and palpitations.  Patient states he has been having symptoms since November.  He generally feels short of breath whenever he tries to exert himself or is active.  He also has intermittent episodes of feeling his heart race.  His shortness of breath is not limited to when he has palpitations.  The discomfort in his chest feels like a pressure.  He called his doctor to try to schedule an appointment and because of his symptoms they recommended ED evaluation.  He has an occasional cough but denies any frequent coughing.  No leg swelling.  Home Medications Prior to Admission medications   Medication Sig Start Date End Date Taking? Authorizing Provider  omeprazole (PRILOSEC) 40 MG capsule Take 1 capsule (40 mg total) by mouth daily. 10/08/23   Sheliah Hatch, MD  rosuvastatin (CRESTOR) 20 MG tablet Take 1 tablet (20 mg total) by mouth daily. 09/20/23   Sheliah Hatch, MD  valsartan (DIOVAN) 80 MG tablet Take 1 tablet (80 mg total) by mouth daily. 09/17/23   Sheliah Hatch, MD      Allergies    Patient has no known allergies.    Review of Systems   Review of Systems  Respiratory:  Positive for shortness of breath.     Physical Exam Updated Vital Signs BP (!) 126/96   Pulse 76   Temp 97.9 F (36.6 C) (Oral)   Resp 17   SpO2 98%  Physical Exam Vitals and nursing note reviewed.  Constitutional:      General: He is not in acute distress.    Appearance: He is well-developed.  HENT:     Head: Normocephalic and atraumatic.     Right Ear: External ear  normal.     Left Ear: External ear normal.  Eyes:     General: No scleral icterus.       Right eye: No discharge.        Left eye: No discharge.     Conjunctiva/sclera: Conjunctivae normal.  Neck:     Trachea: No tracheal deviation.  Cardiovascular:     Rate and Rhythm: Normal rate and regular rhythm.  Pulmonary:     Effort: Pulmonary effort is normal. No respiratory distress.     Breath sounds: Normal breath sounds. No stridor. No wheezing or rales.  Abdominal:     General: Bowel sounds are normal. There is no distension.     Palpations: Abdomen is soft.     Tenderness: There is no abdominal tenderness. There is no guarding or rebound.  Musculoskeletal:        General: No tenderness or deformity.     Cervical back: Neck supple.  Skin:    General: Skin is warm and dry.     Findings: No rash.  Neurological:     General: No focal deficit present.     Mental Status: He is alert.     Cranial Nerves: No cranial nerve deficit, dysarthria or facial asymmetry.     Sensory: No sensory deficit.     Motor:  No abnormal muscle tone or seizure activity.     Coordination: Coordination normal.  Psychiatric:        Mood and Affect: Mood normal.     ED Results / Procedures / Treatments   Labs (all labs ordered are listed, but only abnormal results are displayed) Labs Reviewed  BASIC METABOLIC PANEL - Abnormal; Notable for the following components:      Result Value   Calcium 8.7 (*)    All other components within normal limits  CBC  D-DIMER, QUANTITATIVE  TROPONIN I (HIGH SENSITIVITY)  TROPONIN I (HIGH SENSITIVITY)    EKG EKG Interpretation Date/Time:  Friday December 10 2023 11:41:33 EST Ventricular Rate:  72 PR Interval:  137 QRS Duration:  99 QT Interval:  368 QTC Calculation: 403 R Axis:   27  Text Interpretation: Sinus rhythm Baseline wander in lead(s) V2 Since last tracing rate slower Confirmed by Linwood Dibbles [phone] on 12/10/2023 11:45:12 AM  Radiology DG Chest 2  View Result Date: 12/10/2023 CLINICAL DATA:  Intermittent shortness of breath for 2-3 months. EXAM: CHEST - 2 VIEW COMPARISON:  Chest radiograph dated May 11, 2016. FINDINGS: The heart size and mediastinal contours are within normal limits. No focal consolidation, pleural effusion, or pneumothorax. No acute osseous abnormality. IMPRESSION: No acute cardiopulmonary findings. Electronically Signed   By: Hart Robinsons M.D.   On: 12/10/2023 12:57    Procedures Procedures    Medications Ordered in ED Medications  aspirin chewable tablet 324 mg (324 mg Oral Given 12/10/23 1221)    ED Course/ Medical Decision Making/ A&P Clinical Course as of 12/10/23 1535  Fri Dec 10, 2023  1349 CBC normal.  Metabolic panel normal.  Troponin D-dimer normal [JK]  1349 Chest x-ray without acute findings [JK]  1517 Serial troponins normal. [JK]    Clinical Course User Index [JK] Linwood Dibbles, MD                                 Medical Decision Making Problems Addressed: Chest pressure: acute illness or injury that poses a threat to life or bodily functions Dyspnea on exertion: acute illness or injury that poses a threat to life or bodily functions  Amount and/or Complexity of Data Reviewed Labs: ordered. Decision-making details documented in ED Course. Radiology: ordered and independent interpretation performed.  Risk OTC drugs.   Patient presented to the ED for evaluation of chest pressure as well as dyspnea on exertion.  Symptoms ongoing for several months.  Patient's chest x-ray does not show any evidence of pneumonia or pneumothorax.  No pleural effusion or mass.  D-dimer is negative.  No findings to suggest DVT.  I doubt pulmonary embolism.  EKG is reassuring.  Patient has low risk heart score and normal serial troponins.  He has normal oxygen saturation here and normal respiratory effort.  Etiology of his symptoms are unclear but I do think he is stable for outpatient follow-up with cardiology.  I  have placed an ambulatory referral  Evaluation and diagnostic testing in the emergency department does not suggest an emergent condition requiring admission or immediate intervention beyond what has been performed at this time.  The patient is safe for discharge and has been instructed to return immediately for worsening symptoms, change in symptoms or any other concerns.         Final Clinical Impression(s) / ED Diagnoses Final diagnoses:  Dyspnea on exertion  Chest pressure  Rx / DC Orders ED Discharge Orders          Ordered    Ambulatory referral to Cardiology       Comments: If you have not heard from the Cardiology office within the next 72 hours please call [phone].   12/10/23 1535              Linwood Dibbles, MD 12/10/23 1537

## 2023-12-10 NOTE — Discharge Instructions (Addendum)
The test today in the ED were reassuring.  Follow-up with a cardiologist for further evaluation.  The office should call you to schedule an appointment

## 2023-12-13 NOTE — Telephone Encounter (Signed)
I have reported an error for this, patient should have been added to someone else's schedule if we had no openings.

## 2024-01-03 ENCOUNTER — Other Ambulatory Visit: Payer: Self-pay | Admitting: Family Medicine

## 2024-02-02 ENCOUNTER — Other Ambulatory Visit: Payer: Self-pay | Admitting: Family Medicine

## 2024-02-20 ENCOUNTER — Other Ambulatory Visit: Payer: Self-pay | Admitting: Family Medicine

## 2024-02-23 ENCOUNTER — Other Ambulatory Visit: Payer: Self-pay | Admitting: Family Medicine

## 2024-02-23 NOTE — Telephone Encounter (Signed)
 Copied from CRM [id number]. Topic: Clinical - Medication Refill >> Feb 23, 2024 11:07 AM Myrtice Lauth wrote: Most Recent Primary Care Visit:  Provider: Sheliah Hatch  Department: [facility]  Visit Type: OFFICE VISIT  Date: 10/08/2023  Medication: valsartan (DIOVAN) 80 MG tablet  Has the patient contacted their pharmacy? Yes (Agent: If no, request that the patient contact the pharmacy for the refill. If patient does not wish to contact the pharmacy document the reason why and proceed with request.) (Agent: If yes, when and what did the pharmacy advise?)  Is this the correct pharmacy for this prescription? Yes If no, delete pharmacy and type the correct one.  This is the patient's preferred pharmacy:  [facility] [address] [place] - [address] [place] [address] Phone: [phone] Fax: [phone]  [facility] [address] [place] - [address] [place] [address] Phone: [phone] Fax: [phone]  [facility] [address] [place] [address] Phone: [phone] Fax: [phone]   Has the prescription been filled recently? Yes  Is the patient out of the medication? Yes  Has the patient been seen for an appointment in the last year OR does the patient have an upcoming appointment? Yes  Can we respond through MyChart? Yes  Agent: Please be advised that Rx refills may take up to 3 business days. We ask that you follow-up with your pharmacy.

## 2024-02-25 ENCOUNTER — Ambulatory Visit (INDEPENDENT_AMBULATORY_CARE_PROVIDER_SITE_OTHER)

## 2024-02-25 ENCOUNTER — Ambulatory Visit: Payer: PPO | Attending: Cardiology | Admitting: Cardiology

## 2024-02-25 ENCOUNTER — Encounter: Payer: Self-pay | Admitting: Cardiology

## 2024-02-25 VITALS — BP 136/88 | HR 67 | Resp 17 | Ht 71.0 in | Wt 226.0 lb

## 2024-02-25 DIAGNOSIS — E782 Mixed hyperlipidemia: Secondary | ICD-10-CM

## 2024-02-25 DIAGNOSIS — R002 Palpitations: Secondary | ICD-10-CM

## 2024-02-25 DIAGNOSIS — R072 Precordial pain: Secondary | ICD-10-CM | POA: Diagnosis not present

## 2024-02-25 DIAGNOSIS — R0609 Other forms of dyspnea: Secondary | ICD-10-CM

## 2024-02-25 MED ORDER — NITROGLYCERIN 0.4 MG SL SUBL
0.4000 mg | SUBLINGUAL_TABLET | SUBLINGUAL | 3 refills | Status: AC | PRN
Start: 2024-02-25 — End: 2024-07-14

## 2024-02-25 MED ORDER — ASPIRIN 81 MG PO TBEC: 81.0000 mg | DELAYED_RELEASE_TABLET | Freq: Every day | ORAL | 12 refills | Status: DC

## 2024-02-25 MED ORDER — METOPROLOL TARTRATE 100 MG PO TABS: 100.0000 mg | ORAL_TABLET | Freq: Once | ORAL | 0 refills | Status: DC

## 2024-02-25 MED ORDER — METOPROLOL SUCCINATE ER 25 MG PO TB24: 25.0000 mg | ORAL_TABLET | Freq: Every day | ORAL | 3 refills | Status: DC

## 2024-02-25 NOTE — Progress Notes (Unsigned)
 Applied a 7 day Zio XT monitor to patient in the office

## 2024-02-25 NOTE — Patient Instructions (Addendum)
 Medication Instructions:  START Metoprolol Succinate 25 mg  START Aspirin 81 mg  START AS NEEDED FOR CHEST PAIN: Nitroglycerin    *If you need a refill on your cardiac medications before your next appointment, please call your pharmacy*  Lab Work: Cbc Bmp Lipidp panel Lpa  If you have labs (blood work) drawn today and your tests are completely normal, you will receive your results only by: MyChart Message (if you have MyChart) OR A paper copy in the mail If you have any lab test that is abnormal or we need to change your treatment, we will call you to review the results.  Testing/Procedures: 1 week zio patch   Your physician has requested that you wear a Zio heart monitor for __7___ days. This will be mailed to your home with instructions on how to apply the monitor and how to return it when finished. Please allow 2 weeks after returning the heart monitor before our office calls you with the results.  CTA   Your physician has requested that you have cardiac CT. Cardiac computed tomography (CT) is a painless test that uses an x-ray machine to take clear, detailed pictures of your heart. For further information please visit [email or web address] Please follow instruction sheet as given.   ECHO  Your physician has requested that you have an echocardiogram. Echocardiography is a painless test that uses sound waves to create images of your heart. It provides your doctor with information about the size and shape of your heart and how well your heart's chambers and valves are working. This procedure takes approximately one hour. There are no restrictions for this procedure. Please do NOT wear cologne, perfume, aftershave, or lotions (deodorant is allowed). Please arrive 15 minutes prior to your appointment time.  Please note: We ask at that you not bring children with you during ultrasound (echo/ vascular) testing. Due to room size and safety concerns, children are not allowed in the  ultrasound rooms during exams. Our front office staff cannot provide observation of children in our lobby area while testing is being conducted. An adult accompanying a patient to their appointment will only be allowed in the ultrasound room at the discretion of the ultrasound technician under special circumstances. We apologize for any inconvenience.   Follow-Up: At [facility], you and your health needs are our priority.  As part of our continuing mission to provide you with exceptional heart care, our providers are all part of one team.  This team includes your primary Cardiologist (physician) and Advanced Practice Providers or APPs (Physician Assistants and Nurse Practitioners) who all work together to provide you with the care you need, when you need it.  Your next appointment:   2 month(s)  Provider:   APP or Dr .Rosemary Holms      We recommend signing up for the patient portal called "MyChart".  Sign up information is provided on this After Visit Summary.  MyChart is used to connect with patients for Virtual Visits (Telemedicine).  Patients are able to view lab/test results, encounter notes, upcoming appointments, etc.  Non-urgent messages can be sent to your provider as well.   To learn more about what you can do with MyChart, go to [email or web address].   Other Instructions   Your cardiac CT will be scheduled at one of the below locations:   [facility] [address] [place], [place] [address] [phone]   If scheduled at [facility], please arrive at the [facility] and Children's Entrance (Entrance  C2) of [facility] 30 minutes prior to test start time. You can use the FREE valet parking offered at entrance C (encouraged to control the heart rate for the test)  Proceed to the [facility] Radiology Department (first floor) to check-in and test prep.  All radiology patients and guests should use entrance C2 at [facility], accessed  from [facility], even though the hospital's physical address listed is [address].    If scheduled at [facility] or [facility], please arrive 15 mins early for check-in and test prep.  There is spacious parking and easy access to the radiology department from the [facility] Heart and Vascular entrance. Please enter here and check-in with the desk attendant.   Please follow these instructions carefully (unless otherwise directed):  An IV will be required for this test and Nitroglycerin will be given.  Hold all erectile dysfunction medications at least 3 days (72 hrs) prior to test. (Ie viagra, cialis, sildenafil, tadalafil, etc)   On the Night Before the Test: Be sure to Drink plenty of water. Do not consume any caffeinated/decaffeinated beverages or chocolate 12 hours prior to your test. Do not take any antihistamines 12 hours prior to your test.   On the Day of the Test: Drink plenty of water until 1 hour prior to the test. Do not eat any food 1 hour prior to test. You may take your regular medications prior to the test.  Take metoprolol (Lopressor) 100 MG two hours prior to test. HOLD METOPROLOL SUCCINATE 25 MG UNTIL NEXT DAY AFTER CTA. If you take Furosemide/Hydrochlorothiazide/Spironolactone, please HOLD on the morning of the test. FEMALES- please wear underwire-free bra if available, avoid dresses & tight clothing  *For Clinical Staff only. Please instruct patient the following:* Heart Rate Medication Recommendations for Cardiac CT  Resting HR < 50 bpm  No medication  Resting HR 50-60 bpm and BP >110/50 mmHG   Consider Metoprolol tartrate 25 mg PO 90-120 min prior to scan  Resting HR 60-65 bpm and BP >110/50 mmHG  Metoprolol tartrate 50 mg PO 90-120 minutes prior to scan   Resting HR > 65 bpm and BP >110/50 mmHG  Metoprolol tartrate 100 mg PO 90-120 minutes prior to scan  Consider Ivabradine 10-15 mg PO or a  calcium channel blocker for resting HR >60 bpm and contraindication to metoprolol tartrate  Consider Ivabradine 10-15 mg PO in combination with metoprolol tartrate for HR >80 bpm        After the Test: Drink plenty of water. After receiving IV contrast, you may experience a mild flushed feeling. This is normal. On occasion, you may experience a mild rash up to 24 hours after the test. This is not dangerous. If this occurs, you can take Benadryl 25 mg and increase your fluid intake. If you experience trouble breathing, this can be serious. If it is severe call 911 IMMEDIATELY. If it is mild, please call our office. If you take any of these medications: Glipizide/Metformin, Avandament, Glucavance, please do not take 48 hours after completing test unless otherwise instructed.  We will call to schedule your test 2-4 weeks out understanding that some insurance companies will need an authorization prior to the service being performed.   For more information and frequently asked questions, please visit our website : [email or web address]  For non-scheduling related questions, please contact the cardiac imaging nurse navigator should you have any questions/concerns: Cardiac Imaging Nurse Navigators Direct Office Dial:  [phone]   For scheduling needs, including cancellations and rescheduling, please call [place], [phone].       1st Floor: - Lobby - Registration  - Pharmacy  - Lab - Cafe  2nd Floor: - PV Lab - Diagnostic Testing (echo, CT, nuclear med)  3rd Floor: - Vacant  4th Floor: - TCTS (cardiothoracic surgery) - AFib Clinic - Structural Heart Clinic - Vascular Surgery  - Vascular Ultrasound  5th Floor: - HeartCare Cardiology (general and EP) - Clinical Pharmacy for coumadin, hypertension, lipid, weight-loss medications, and med management appointments    Valet parking services will be available as well.

## 2024-02-25 NOTE — Progress Notes (Signed)
 Cardiology Office Note:  .   Date:  02/25/2024  ID:  Richard Marks, DOB Jun 26, 1986, MRN [id number] PCP: Sheliah Hatch, MD  [facility] Providers Cardiologist:  Truett Mainland, MD PCP: Sheliah Hatch, MD  Chief Complaint  Patient presents with   Shortness of Breath   New Patient (Initial Visit)     Richard Marks is a 38 y.o. 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  History of Present Illness The patient, a young adult male with a history of hypertension and hypercholesterolemia, presents with a six-month history of heart racing and shortness of breath. These symptoms occur at rest, upon waking, and while watching television. The patient also reports a sensation of chest heaviness, described as 'something weighing me down,' occurring a couple of times a day. The patient denies chest pain but reports a decrease in activity level due to constant fatigue and the need to rest. The patient denies smoking, alcohol, and caffeine use. The patient's mother mentions a genetic condition diagnosed in childhood, [name], which has led to learning difficulties. The patient also has a family history of strokes and Alzheimer's disease but no known heart disease.    Vitals:   02/25/24 0939  BP: 136/88  Pulse: 67  Resp: 17  SpO2: 97%      Review of Systems  Cardiovascular:  Positive for chest pain, dyspnea on exertion and palpitations. Negative for leg swelling and syncope.        Studies Reviewed: Marland Kitchen        EKG 02/25/2024: Normal sinus rhythm Minimal voltage criteria for LVH, may be normal variant ( R in aVL ) When compared with ECG of 10-Dec-2023 11:41, No significant change since    Independently  interpreted 09/2023-11/2023: Chol 247, TG 342, HDL 30, LDL 148 HbA1C 5.3% Hb 15 Cr 0.9 TSH 1.1   Physical Exam Vitals and nursing note reviewed.  Constitutional:      General: He is not in acute distress. Neck:     Vascular: No JVD.  Cardiovascular:     Rate and Rhythm: Normal rate and regular rhythm.     Heart sounds: Normal heart sounds. No murmur heard. Pulmonary:     Effort: Pulmonary effort is normal.     Breath sounds: Normal breath sounds. No wheezing or rales.  Musculoskeletal:     Right lower leg: No edema.     Left lower leg: No edema.      VISIT DIAGNOSES:   ICD-10-CM   1. Exertional dyspnea  R06.09 EKG 12-Lead    ECHOCARDIOGRAM COMPLETE    2. Mixed hyperlipidemia  E78.2 Lipid panel    Lipoprotein A (LPA)    3. Precordial pain  R07.2 CBC    Basic metabolic panel with GFR    CT CORONARY MORPH W/CTA COR W/SCORE W/CA W/CM &/OR WO/CM    ECHOCARDIOGRAM COMPLETE    CANCELED: ECHOCARDIOGRAM COMPLETE    4. Palpitations  R00.2 LONG TERM MONITOR (3-14 DAYS)       Richard Marks is a 38 y.o. 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  Physical exam unremarkable for heart failure. Recommend echocardiogram,  coronary CT angiogram. In the meantime, recommend aspirin 81 mg daily, metoprolol succinate 25 mg daily, sublingual nitroglycerin as needed. Continue valsartan and Crestor. Check lipid panel and lipoprotein a, along with CBC and BMP.  Palpitations: I suspect this may be due to decreased exercise tolerance, but recommend 1 week Zio monitor to rule out any tacky arrhythmia.    Hypertension: Management as above.  Mixed hyperlipidemia: Management as above.      Meds ordered this encounter  Medications    aspirin EC 81 MG tablet    Sig: Take 1 tablet (81 mg total) by mouth daily. Swallow whole.    Dispense:  30 tablet    Refill:  12   metoprolol succinate (TOPROL XL) 25 MG 24 hr tablet    Sig: Take 1 tablet (25 mg total) by mouth daily.    Dispense:  90 tablet    Refill:  3   nitroGLYCERIN (NITROSTAT) 0.4 MG SL tablet    Sig: Place 1 tablet (0.4 mg total) under the tongue every 5 (five) minutes as needed for chest pain.    Dispense:  90 tablet    Refill:  3   metoprolol tartrate (LOPRESSOR) 100 MG tablet    Sig: Take 1 tablet (100 mg total) by mouth once for 1 dose. Take 90-120 minutes prior to scan. Hold for SBP less than 110.    Dispense:  1 tablet    Refill:  0     F/u in 2 months  Signed, Elder Negus, MD

## 2024-02-26 ENCOUNTER — Encounter: Payer: Self-pay | Admitting: Cardiology

## 2024-02-27 LAB — BASIC METABOLIC PANEL WITH GFR
BUN/Creatinine Ratio: 17 (ref 9–20)
BUN: 16 mg/dL (ref 6–20)
CO2: 23 mmol/L (ref 20–29)
Calcium: 9.1 mg/dL (ref 8.7–10.2)
Chloride: 105 mmol/L (ref 96–106)
Creatinine, Ser: 0.94 mg/dL (ref 0.76–1.27)
Glucose: 106 mg/dL — ABNORMAL HIGH (ref 70–99)
Potassium: 4.1 mmol/L (ref 3.5–5.2)
Sodium: 140 mmol/L (ref 134–144)
eGFR: 107 mL/min/{1.73_m2} (ref >59)

## 2024-02-27 LAB — LIPID PANEL
Chol/HDL Ratio: 3.4 ratio (ref 0.0–5.0)
Cholesterol, Total: 98 mg/dL — ABNORMAL LOW (ref 100–199)
HDL: 29 mg/dL — ABNORMAL LOW (ref >39)
LDL Chol Calc (NIH): 50 mg/dL (ref 0–99)
Triglycerides: 102 mg/dL (ref 0–149)
VLDL Cholesterol Cal: 19 mg/dL (ref 5–40)

## 2024-02-27 LAB — CBC
Hematocrit: 42.9 % (ref 37.5–51.0)
Hemoglobin: 14.8 g/dL (ref 13.0–17.7)
MCH: 33.5 pg — ABNORMAL HIGH (ref 26.6–33.0)
MCHC: 34.5 g/dL (ref 31.5–35.7)
MCV: 97 fL (ref 79–97)
Platelets: 182 10*3/uL (ref 150–450)
RBC: 4.42 x10E6/uL (ref 4.14–5.80)
RDW: 12.7 % (ref 11.6–15.4)
WBC: 5.9 10*3/uL (ref 3.4–10.8)

## 2024-02-27 LAB — LIPOPROTEIN A (LPA): Lipoprotein (a): 89.9 nmol/L — ABNORMAL HIGH (ref <75.0)

## 2024-03-07 DIAGNOSIS — R002 Palpitations: Secondary | ICD-10-CM | POA: Diagnosis not present

## 2024-03-08 ENCOUNTER — Encounter: Payer: Self-pay | Admitting: Cardiology

## 2024-03-08 DIAGNOSIS — R002 Palpitations: Secondary | ICD-10-CM | POA: Diagnosis not present

## 2024-03-08 NOTE — Progress Notes (Signed)
 No arrhythmias noted on monitoring, essentially normal sinus rhythm.  Nothing to worry about, please reassure patient.

## 2024-03-20 ENCOUNTER — Other Ambulatory Visit: Payer: Self-pay | Admitting: Family Medicine

## 2024-03-24 ENCOUNTER — Other Ambulatory Visit: Payer: Self-pay | Admitting: Family Medicine

## 2024-03-31 ENCOUNTER — Other Ambulatory Visit (HOSPITAL_COMMUNITY)

## 2024-04-06 ENCOUNTER — Telehealth (HOSPITAL_COMMUNITY): Payer: Self-pay | Admitting: *Deleted

## 2024-04-06 NOTE — Telephone Encounter (Signed)
 Reaching out to patient to offer assistance regarding upcoming cardiac imaging study; pt's mother verbalizes understanding of appt date/time, parking situation and where to check in, pre-test NPO status, and verified current allergies; name and call back number provided for further questions should they arise  Chase Copping RN Navigator Cardiac Imaging [facility] [phone] office [phone] cell  Patient to take his daily medication.

## 2024-04-07 ENCOUNTER — Ambulatory Visit: Payer: Self-pay | Admitting: Cardiology

## 2024-04-07 ENCOUNTER — Ambulatory Visit (HOSPITAL_BASED_OUTPATIENT_CLINIC_OR_DEPARTMENT_OTHER)
Admission: RE | Admit: 2024-04-07 | Discharge: 2024-04-07 | Disposition: A | Discharge status: Home / Self Care | Source: Ambulatory Visit | Attending: Cardiology | Admitting: Cardiology

## 2024-04-07 ENCOUNTER — Ambulatory Visit (HOSPITAL_COMMUNITY)
Admission: RE | Admit: 2024-04-07 | Discharge: 2024-04-07 | Disposition: A | Discharge status: Home / Self Care | Source: Ambulatory Visit | Attending: Cardiology | Admitting: Cardiology

## 2024-04-07 DIAGNOSIS — R0609 Other forms of dyspnea: Secondary | ICD-10-CM | POA: Insufficient documentation

## 2024-04-07 DIAGNOSIS — R072 Precordial pain: Secondary | ICD-10-CM

## 2024-04-07 DIAGNOSIS — I209 Angina pectoris, unspecified: Secondary | ICD-10-CM | POA: Diagnosis not present

## 2024-04-07 LAB — ECHOCARDIOGRAM COMPLETE
Area-P 1/2: 3.17 cm2
S' Lateral: 2.9 cm

## 2024-04-07 MED ORDER — NITROGLYCERIN 0.4 MG SL SUBL
SUBLINGUAL_TABLET | SUBLINGUAL | Status: AC
  Filled 2024-04-07: qty 2

## 2024-04-07 MED ORDER — NITROGLYCERIN 0.4 MG SL SUBL
0.8000 mg | SUBLINGUAL_TABLET | Freq: Once | SUBLINGUAL | Status: AC
  Administered 2024-04-07: 0.8 mg via SUBLINGUAL

## 2024-04-07 MED ORDER — IOHEXOL 350 MG/ML SOLN
100.0000 mL | Freq: Once | INTRAVENOUS | Status: AC | PRN
  Administered 2024-04-07: 100 mL via INTRAVENOUS

## 2024-04-20 ENCOUNTER — Encounter: Payer: Self-pay | Admitting: Pharmacist

## 2024-04-20 ENCOUNTER — Other Ambulatory Visit: Payer: Self-pay | Admitting: Pharmacist

## 2024-04-20 DIAGNOSIS — E785 Hyperlipidemia, unspecified: Secondary | ICD-10-CM

## 2024-04-20 NOTE — Telephone Encounter (Signed)
 Rosuvastatin  refill past due. Spoke with patient's mother / patient and refill is needed. Will send request to PCP.

## 2024-04-20 NOTE — Progress Notes (Signed)
 Pharmacy Quality Measure Review  This patient is appearing on a report for being at risk of failing the adherence measure for cholesterol (statin) and hypertension (ACEi/ARB) medications this calendar year.   Medication: rosuvastatin  20mg  daily  Last fill date: 01/02/2024 for 30 day supply - his mother states he usually take this at night. She makes sure he takes his daytime medications but he is responsible for the night medications. She report he has about 2 weeks of rosuvastatin  in his bottle from 2/16 so she feels that he is likely missing doses.  Medication: valsartan  80mg  Last fill date: 02/21/2024 for 30 day supply per adherence report. However patient has filled valsartan  for 90 days on 03/20/2024.   Lab Results  Component Value Date   CHOL 98 (L) 02/25/2024   HDL 29 (L) 02/25/2024   LDLCALC 50 02/25/2024   LDLDIRECT 118.0 10/20/2017   TRIG 102 02/25/2024   CHOLHDL 3.4 02/25/2024   BP Readings from Last 3 Encounters:  02/25/24 136/88  12/10/23 (!) 127/94  10/08/23 130/78    Assessment / Plan: Insurance report was not up to date for valsartan  refill  Patient has been missing doses of rosuvastatin  - recommended he moved dose from evening to morning with his other medications.  Will collaborate with provider to facilitate refill needs. Also notified patient and caregiver of Dr Silverio Drought recommendation to stop aspirin . (Seen result follow up note from 04/11/2024)  Cecilie Coffee, PharmD Clinical Pharmacist [facility]  Population Health [phone]

## 2024-04-23 MED ORDER — ROSUVASTATIN CALCIUM 20 MG PO TABS: 20.0000 mg | ORAL_TABLET | Freq: Every day | ORAL | 1 refills | Status: DC

## 2024-04-28 ENCOUNTER — Encounter: Payer: Self-pay | Admitting: Cardiology

## 2024-04-28 ENCOUNTER — Ambulatory Visit: Attending: Cardiology | Admitting: Cardiology

## 2024-04-28 VITALS — BP 124/81 | HR 63 | Ht 71.0 in | Wt 226.4 lb

## 2024-04-28 DIAGNOSIS — I10 Essential (primary) hypertension: Secondary | ICD-10-CM | POA: Diagnosis not present

## 2024-04-28 DIAGNOSIS — E782 Mixed hyperlipidemia: Secondary | ICD-10-CM | POA: Insufficient documentation

## 2024-04-28 NOTE — Patient Instructions (Signed)
  Follow-Up: At [facility], you and your health needs are our priority.  As part of our continuing mission to provide you with exceptional heart care, our providers are all part of one team.  This team includes your primary Cardiologist (physician) and Advanced Practice Providers or APPs (Physician Assistants and Nurse Practitioners) who all work together to provide you with the care you need, when you need it.  Your next appointment:   As Needed  Provider:   Cody Das, MD    We recommend signing up for the patient portal called MyChart.  Sign up information is provided on this After Visit Summary.  MyChart is used to connect with patients for Virtual Visits (Telemedicine).  Patients are able to view lab/test results, encounter notes, upcoming appointments, etc.  Non-urgent messages can be sent to your provider as well.   To learn more about what you can do with MyChart, go to [email or web address].

## 2024-04-28 NOTE — Progress Notes (Signed)
 Cardiology Office Note:  .   Date:  04/28/2024  ID:  [name], DOB 09/22/1986, MRN [id number] PCP: Jess Morita, MD  [facility] Providers Cardiologist:  Fransico Ivy, MD PCP: Jess Morita, MD  Chief Complaint  Patient presents with   Hyperlipidemia   exertional dyspnea   Follow-up     Richard Marks is a 38 y.o. 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  He has not had any recurrent chest pain or shortness of breath symptoms.  Chest burning symptoms have improved with omeprazole .  He has not had recurrent palpitation symptoms either.  Reviewed recent test results with the patient, details below.    Vitals:   04/28/24 0912  BP: 124/81  Pulse: 63  SpO2: 98%      Review of Systems  Cardiovascular:  Positive for chest pain, dyspnea on exertion and palpitations. Negative for leg swelling and syncope.        Studies Reviewed: Aaron Aas        EKG 02/25/2024: Normal sinus rhythm Minimal voltage criteria for LVH, may be normal variant ( R in aVL ) When compared with ECG of 10-Dec-2023 11:41, No significant change since  Cor CTA 03/2024: 1. Coronary calcium  score of 0. This was 0 percentile for age and sex matched control. 2. Normal coronary origin with right dominance. 3. CAD-RADS 1. Minimal non-obstructive CAD (0-24%). Consider non-atherosclerotic causes of chest pain. Consider preventive therapy and risk factor modification. 4. Total plaque volume 17 mm3 which is 64th percentile for age- and sex-matched controls (calcified plaque 0 mm3; non-calcified plaque 17 mm3, Low attenuation 0 mm3).      Echocardiogram 03/2024: 1. Left ventricular ejection fraction, by estimation, is 60 to 65%. Left  ventricular ejection fraction by 3D volume is 62 %. The left ventricle has   normal function. The left ventricle has no regional wall motion  abnormalities. Left ventricular diastolic   parameters were normal. The average left ventricular global longitudinal  strain is -21.6 %. The global longitudinal strain is normal.   2. Right ventricular systolic function is normal. The right ventricular  size is normal. There is normal pulmonary artery systolic pressure.   3. The mitral valve is normal in structure. Trivial mitral valve  regurgitation. No evidence of mitral stenosis.   4. The aortic valve is tricuspid. Aortic valve regurgitation is trivial.  No aortic stenosis is present.   Comparison(s): No prior Echocardiogram.   Conclusion(s)/Recommendation(s): Otherwise normal echocardiogram, with  minor abnormalities described in the report.   Remote extended EKG monitoring 12 days starting 02/25/2024 for palpitations: Predominant rhythm is normal sinus rhythm.  Minimum heart rate 47, maximum heart rate 117 bpm with average heart rate of 72 bpm. Rare PACs and PVCs. Patient triggered events (2) normal sinus rhythm with a heart rate of 64 to 69 bpm. No other significant arrhythmias noted, no atrial fibrillation, no heart block.  Unremarkable monitoring.   Labs 02/2024: Chol 98, TG 102, HDL 29, LDL 50 HbA1C 5.3% Hb 15 Cr 0.9   Labs 09/2023-11/2023: Chol 247, TG 342, HDL 30, LDL 148 HbA1C 5.3% Hb 15 Cr 0.9 TSH 1.1   Physical Exam Vitals and nursing note reviewed.  Constitutional:      General: He is not in acute distress. Neck:     Vascular: No JVD.   Cardiovascular:  Rate and Rhythm: Normal rate and regular rhythm.     Heart sounds: Normal heart sounds. No murmur heard. Pulmonary:     Effort: Pulmonary effort is normal.     Breath sounds: Normal breath sounds. No wheezing or rales.   Musculoskeletal:     Right lower leg: No edema.     Left lower leg: No edema.      VISIT DIAGNOSES:   ICD-10-CM   1. Mixed hyperlipidemia  E78.2     2.  Primary hypertension  I10         Richard Marks is a 38 y.o. 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  No obstructive CAD, structurally normal heart. Continue omeprazole  for chest burning, likely GERD.  Follow-up with PCP.  Mixed hyperlipidemia: LDL down from 148-50 on Crestor  20 mg daily.  Continue the same.  Hypertension: Well-controlled on valsartan .    F/u as needed  Signed, Cody Das, MD

## 2024-05-03 ENCOUNTER — Other Ambulatory Visit: Payer: Self-pay | Admitting: Family Medicine

## 2024-05-05 ENCOUNTER — Ambulatory Visit (INDEPENDENT_AMBULATORY_CARE_PROVIDER_SITE_OTHER): Admitting: Family Medicine

## 2024-05-05 ENCOUNTER — Encounter: Payer: Self-pay | Admitting: Family Medicine

## 2024-05-05 VITALS — BP 112/72 | HR 65 | Temp 97.8°F | Resp 16 | Ht 71.0 in | Wt 229.0 lb

## 2024-05-05 DIAGNOSIS — L989 Disorder of the skin and subcutaneous tissue, unspecified: Secondary | ICD-10-CM | POA: Diagnosis not present

## 2024-05-05 NOTE — Progress Notes (Signed)
   Subjective:    Patient ID: Richard Marks, male    DOB: [birth year], 38 y.o.   MRN: [id number]  HPI Sore on scalp- friend recently dx'd w/ melanoma.  Pt has area on scalp that 'looks like a wart, but it's pink'.  Has been present for a few months.  Just recently became sore to touch.  No drainage.   Review of Systems For ROS see HPI     Objective:   Physical Exam Vitals reviewed.  Constitutional:      General: He is not in acute distress.    Appearance: Normal appearance. He is not ill-appearing.  HENT:     Head: Normocephalic and atraumatic.   Skin:    General: Skin is warm and dry.     Comments: 1cm pink/flesh colored nevus w/ scab/irritation on R posterior scalp   Neurological:     General: No focal deficit present.     Mental Status: He is alert and oriented to person, place, and time.   Psychiatric:        Mood and Affect: Mood normal.        Behavior: Behavior normal.        Thought Content: Thought content normal.           Assessment & Plan:  Scalp lesion- new.  Appears to be an irritated nevus.  Pt would like this removed based on location and the fact that he routinely scratches this area.  Will refer to Derm.  Pt expressed understanding and is in agreement w/ plan.

## 2024-05-05 NOTE — Patient Instructions (Signed)
 Follow up as needed or as scheduled We'll call you with your Dermatology referral- this can take awhile so don't get frustrated Call with any questions or concerns Stay Safe!  Stay Healthy! Have a great summer!!!

## 2024-05-30 ENCOUNTER — Other Ambulatory Visit: Payer: Self-pay | Admitting: Family Medicine

## 2024-06-17 ENCOUNTER — Other Ambulatory Visit: Payer: Self-pay | Admitting: Family Medicine

## 2024-07-13 ENCOUNTER — Ambulatory Visit: Payer: Self-pay

## 2024-07-13 NOTE — Telephone Encounter (Signed)
 FYI Only or Action Required?: FYI only for provider.  Patient was last seen in primary care on 05/05/2024 by Mahlon Comer BRAVO, MD.  Called Nurse Triage reporting Depression.  Symptoms began Ongoing .  Interventions attempted: Nothing.  Symptoms are: stable.  Triage Disposition: See PCP When Office is Open (Within 3 Days)  Patient/caregiver understands and will follow disposition?: Yes  Copied from CRM [id number]. Topic: Clinical - Red Word Triage >> Jul 13, 2024  5:25 PM Jayma L wrote: Red Word that prompted transfer to Nurse Triage:   patients mom is on the phone, called in stated patient is really depressed and just feeling worthless. With some anxiety. Asking he be put on some medicine to help with this. Patient is disabled as well Reason for Disposition  Requesting to talk with a counselor (mental health worker, psychiatrist, etc.)  Answer Assessment - Initial Assessment Questions Patients mom Virginia  called on behalf of patient, states he's feeling worthless and stuck. Has a disability and feels limited. Denies feelings of hurting himself and hurting anyone else. Mom states progressively getting worse.    1. CONCERN: What happened that made you call today?     Mom known he has had a problem with an employer not treating him equally and feel like patient is being taken advantage of.   2. DEPRESSION SYMPTOM SCREENING: How are you feeling overall? (e.g., decreased energy, increased sleeping or difficulty sleeping, difficulty concentrating, feelings of sadness, guilt, hopelessness, or worthlessness)     Decreased appetite, feelings of worthlessness.   3. RISK OF HARM - SUICIDAL IDEATION:  Do you ever have thoughts of hurting or killing yourself?  (e.g., yes, no, no but preoccupation with thoughts about death)     No  4. RISK OF HARM - HOMICIDAL IDEATION:  Do you ever have thoughts of hurting or killing someone else?  (e.g., yes, no, no but preoccupation with thoughts  about death)     No       5. SUPPORT: Who is with you now? Who do you live with? Do you have family or friends who you can talk to?      Yes, mom and dad.  6. THERAPIST: Do you have a counselor or therapist? If Yes, ask: What is their name?     No  7. STRESSORS: Has there been any new stress or recent changes in your life?     Issues with employer, feelings have just been progressively adding up and getting worse.  8.. OTHER: Do you have any other physical symptoms right now? (e.g., fever)       No  Protocols used: Depression-A-AH

## 2024-07-14 ENCOUNTER — Encounter: Payer: Self-pay | Admitting: Family

## 2024-07-14 ENCOUNTER — Ambulatory Visit: Admitting: Family

## 2024-07-14 VITALS — BP 114/76 | HR 75 | Temp 98.4°F | Ht 71.0 in | Wt 210.6 lb

## 2024-07-14 DIAGNOSIS — F411 Generalized anxiety disorder: Secondary | ICD-10-CM | POA: Diagnosis not present

## 2024-07-14 DIAGNOSIS — F321 Major depressive disorder, single episode, moderate: Secondary | ICD-10-CM | POA: Diagnosis not present

## 2024-07-14 MED ORDER — ESCITALOPRAM OXALATE 5 MG PO TABS: 5.0000 mg | ORAL_TABLET | Freq: Every day | ORAL | 1 refills | Status: DC

## 2024-07-14 NOTE — Progress Notes (Signed)
 Patient ID: Richard Marks, male    DOB: August 31, 1986, 38 y.o.   MRN: [id number]  Chief Complaint  Patient presents with   Depression    Pt c/o Depression, present for 6 months to 1 year and worsening.  Discussed the use of AI scribe software for clinical note transcription with the patient, who gave verbal consent to proceed.  History of Present Illness   Richard Marks is a 38 year old male who presents with anxiety and depression.  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 History of anxiety and depression since childhood, previously treated with Buspar and therapy. Not currently on medication. Discussed Lexapro  as a treatment option. - Prescribe Lexapro  (escitalopram ) 5 mg daily in the morning. - Discuss potential side effects of Lexapro , including nausea, diarrhea, and initial worsening of anxiety, which should improve after a few days. - Provided information on counseling services and advise scheduling an appointment for additional support. - Follow-up with Dr Mahlon in four weeks to assess medication effectiveness and adjust  dose if necessary.    Subjective:    Outpatient Medications Prior to Visit  Medication Sig Dispense Refill   omeprazole  (PRILOSEC) 40 MG capsule Take 1 capsule by mouth once daily 90 capsule 0   rosuvastatin  (CRESTOR ) 20 MG tablet Take 1 tablet (20 mg total) by mouth daily. 90 tablet 1   valsartan  (DIOVAN ) 80 MG tablet Take 1 tablet by mouth once daily 90 tablet 0   nitroGLYCERIN  (NITROSTAT ) 0.4 MG SL tablet Place 1 tablet (0.4 mg total) under the tongue every 5 (five) minutes as needed for chest pain. (Patient not taking: Reported on 07/14/2024) 90 tablet 3   No facility-administered medications prior to visit.   Past Medical History:  Diagnosis Date   Acid reflux    Hypercholesterolemia    Hypertension    [name]    Diagnosed at [facility] when patient was 38 years old   Past Surgical History:  Procedure Laterality Date   NO PAST SURGERIES     No Known Allergies    Objective:    Physical Exam Vitals and nursing note reviewed.  Constitutional:      General: He is not in acute distress.    Appearance: Normal appearance.  HENT:     Head: Normocephalic.  Cardiovascular:     Rate and Rhythm: Normal rate and regular rhythm.  Pulmonary:     Effort: Pulmonary effort is normal.     Breath sounds: Normal breath sounds.  Musculoskeletal:        General: Normal range of motion.     Cervical back: Normal range of motion.  Skin:    General: Skin  is warm and dry.  Neurological:     Mental Status: He is alert and oriented to person, place, and time.  Psychiatric:        Mood and Affect: Mood normal.    BP 114/76 (BP Location: Left Arm, Patient Position: Sitting)   Pulse 75   Temp 98.4 F (36.9 C) (Temporal)   Ht 5' 11 (1.803 m)   Wt 210 lb 9.6 oz (95.5 kg)   SpO2 97%   BMI 29.37 kg/m  Wt Readings from Last 3 Encounters:  07/14/24 210 lb 9.6 oz (95.5 kg)  05/05/24 229 lb (103.9 kg)  04/28/24 226 lb 6.4 oz (102.7 kg)      Lucius Krabbe, NP

## 2024-07-14 NOTE — Patient Instructions (Addendum)
 It was very nice to see you today!   Please schedule a 4 week follow up visit with Dr Mahlon.  I have sent over generic Lexapro  to start in the morning.  I have sent a referral to our therapy team for counseling. But you have to call their office to schedule your first appointment. Start with [facility] at [phone] if they can't schedule you quickly you can try [facility] at [phone].       PLEASE NOTE:  If you had any lab tests please let us  know if you have not heard back within a few days. You may see your results on MyChart before we have a chance to review them but we will give you a call once they are reviewed by us . If we ordered any referrals today, please let us  know if you have not heard from their office within the next week.

## 2024-07-25 ENCOUNTER — Other Ambulatory Visit: Payer: Self-pay

## 2024-07-25 MED ORDER — OMEPRAZOLE 40 MG PO CPDR: 40.0000 mg | DELAYED_RELEASE_CAPSULE | Freq: Every day | ORAL | 0 refills | Status: AC

## 2024-08-07 ENCOUNTER — Telehealth: Payer: Self-pay | Admitting: *Deleted

## 2024-08-07 NOTE — Telephone Encounter (Unsigned)
 Copied from CRM [id number]. Topic: Clinical - Medication Question >> Aug 07, 2024  9:13 AM Pinkey ORN wrote: Reason for CRM: escitalopram  (LEXAPRO ) 5 MG tablet >> Aug 07, 2024  9:15 AM Pinkey ORN wrote: Patient's mother Virginia  called, states that patient has an appointment scheduled with behavioral health but it isn't until October and wanted to know if he could get a refill on his escitalopram  (LEXAPRO ) 5 MG tablet to hold him over until the appointment. Please follow up with Virginia  mother at [phone]

## 2024-08-07 NOTE — Telephone Encounter (Signed)
 Ok to provide #30, no refills for pt

## 2024-08-07 NOTE — Telephone Encounter (Signed)
 Are you okay with refilling patients  escitalopram  (LEXAPRO ) 5 MG tablet  until he can get in with behavioral health in October.

## 2024-08-08 ENCOUNTER — Other Ambulatory Visit: Payer: Self-pay

## 2024-08-08 DIAGNOSIS — F321 Major depressive disorder, single episode, moderate: Secondary | ICD-10-CM

## 2024-08-08 MED ORDER — ESCITALOPRAM OXALATE 5 MG PO TABS: 5.0000 mg | ORAL_TABLET | Freq: Every day | ORAL | 0 refills | Status: DC

## 2024-08-08 NOTE — Telephone Encounter (Signed)
 Pts mom has been notified

## 2024-09-06 ENCOUNTER — Ambulatory Visit (HOSPITAL_COMMUNITY): Payer: Self-pay | Admitting: Licensed Clinical Social Worker

## 2024-09-13 ENCOUNTER — Ambulatory Visit (INDEPENDENT_AMBULATORY_CARE_PROVIDER_SITE_OTHER)

## 2024-09-13 VITALS — Ht 71.0 in | Wt 210.0 lb

## 2024-09-13 DIAGNOSIS — Z0000 Encounter for general adult medical examination without abnormal findings: Secondary | ICD-10-CM | POA: Diagnosis not present

## 2024-09-13 NOTE — Progress Notes (Signed)
 Subjective:   Richard Marks is a 38 y.o. who presents for a Medicare Wellness preventive visit.  As a reminder, Annual Wellness Visits don't include a physical exam, and some assessments may be limited, especially if this visit is performed virtually. We may recommend an in-person follow-up visit with your provider if needed.  Visit Complete: Virtual I connected with  Alm JINNY Goldberg on 09/13/24 by a audio enabled telemedicine application and verified that I am speaking with the correct person using two identifiers.  Patient Location: Home  Provider Location: Home Office  I discussed the limitations of evaluation and management by telemedicine. The patient expressed understanding and agreed to proceed.  Vital Signs: Because this visit was a virtual/telehealth visit, some criteria may be missing or patient reported. Any vitals not documented were not able to be obtained and vitals that have been documented are patient reported.  VideoDeclined- This patient declined Librarian, academic. Therefore the visit was completed with audio only.  Persons Participating in Visit: Patient.  AWV Questionnaire: No: Patient Medicare AWV questionnaire was not completed prior to this visit.  Cardiac Risk Factors include: advanced age (>108men, >49 women);male gender;hypertension     Objective:    Today's Vitals   09/13/24 0813  Weight: 210 lb (95.3 kg)  Height: 5' 11 (1.803 m)   Body mass index is 29.29 kg/m.     09/13/2024    8:17 AM 12/10/2023   11:37 AM 08/05/2023    8:13 AM 10/22/2022    9:49 AM 10/26/2019    4:23 PM 05/11/2016    4:15 AM  Advanced Directives  Does Patient Have a Medical Advance Directive? Yes No No No No No   Type of Advance Directive Healthcare Power of Attorney       Would patient like information on creating a medical advance directive?  No - Patient declined  No - Patient declined No - Patient declined No - patient declined information       Data saved with a previous flowsheet row definition    Current Medications (verified) Outpatient Encounter Medications as of 09/13/2024  Medication Sig   escitalopram  (LEXAPRO ) 5 MG tablet Take 1 tablet (5 mg total) by mouth daily.   omeprazole  (PRILOSEC) 40 MG capsule Take 1 capsule (40 mg total) by mouth daily.   rosuvastatin  (CRESTOR ) 20 MG tablet Take 1 tablet (20 mg total) by mouth daily.   valsartan  (DIOVAN ) 80 MG tablet Take 1 tablet by mouth once daily   nitroGLYCERIN  (NITROSTAT ) 0.4 MG SL tablet Place 1 tablet (0.4 mg total) under the tongue every 5 (five) minutes as needed for chest pain. (Patient not taking: Reported on 09/13/2024)   No facility-administered encounter medications on file as of 09/13/2024.    Allergies (verified) Patient has no known allergies.   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[facility] when patient was 38 years old   Past Surgical History:  Procedure Laterality Date   NO PAST SURGERIES     Family History  Problem Relation Age of Onset   Diabetes Mother    Hypertension Mother    Hyperlipidemia Mother    Depression Mother    Hypertension Father    Diabetes Father    COPD Father    Alcohol abuse Sister    Asthma Sister    Depression Sister  Drug abuse Sister    Hypertension Sister    Hypertension Sister    Depression Sister    Diabetes Sister    Depression Brother    Diabetes Maternal Grandmother    Heart disease Maternal Grandmother    Hyperlipidemia Maternal Grandmother    Kidney disease Maternal Grandmother    Heart disease Maternal Grandfather    Hypertension Maternal Grandfather    Kidney disease Maternal Grandfather    Arthritis Paternal Grandmother    Diabetes Paternal Grandmother    Heart disease Paternal Grandmother    Hyperlipidemia Paternal Grandmother    Hypertension Paternal Grandmother    Kidney  disease Paternal Grandmother    Stroke Paternal Grandmother    Asthma Paternal Grandfather    COPD Paternal Grandfather    Hyperlipidemia Paternal Grandfather    Hypertension Paternal Grandfather    Kidney disease Paternal Grandfather    Social History   Socioeconomic History   Marital status: Single    Spouse name: Not on file   Number of children: 0   Years of education: Not on file   Highest education level: Not on file  Occupational History   Occupation: disabled  Tobacco Use   Smoking status: Never   Smokeless tobacco: Never  Vaping Use   Vaping status: Never Used  Substance and Sexual Activity   Alcohol use: No   Drug use: No   Sexual activity: Never  Other Topics Concern   Not on file  Social History Narrative   Learning disabilityNon smokerOccasional drinkerHigh school graduateLives at home with mother and 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)   Lives with parents/2025 has 1 dog   Social Drivers of Corporate Investment Banker Strain: Low Risk  (09/13/2024)   Overall Financial Resource Strain (CARDIA)    Difficulty of Paying Living Expenses: Not very hard  Food Insecurity: No Food Insecurity (09/13/2024)   Hunger Vital Sign    Worried About Running Out of Food in the Last Year: Never true    Ran Out of Food in the Last Year: Never true  Transportation Needs: No Transportation Needs (09/13/2024)   PRAPARE - Administrator, Civil Service (Medical): No    Lack of Transportation (Non-Medical): No  Physical Activity: Sufficiently Active (09/13/2024)   Exercise Vital Sign    Days of Exercise per Week: 2 days    Minutes of Exercise per Session: 150+ min  Stress: No Stress Concern Present (09/13/2024)   [employer] of Occupational Health - Occupational Stress Questionnaire    Feeling of Stress: Not at all  Social Connections: Socially Isolated (09/13/2024)   Social Connection and Isolation Panel    Frequency of Communication with Friends  and Family: Twice a week    Frequency of Social Gatherings with Friends and Family: Once a week    Attends Religious Services: Never    Database Administrator or Organizations: No    Attends Engineer, Structural: Never    Marital Status: Never married    Tobacco Counseling Counseling given: Not Answered    Clinical Intake:  Pre-visit preparation completed: Yes  Pain : No/denies pain     BMI - recorded: 29.29 Nutritional Status: BMI 25 -29 Overweight Nutritional Risks: None Diabetes: No  Lab Results  Component Value Date   HGBA1C 5.3 07/30/2021   HGBA1C 5.2 10/21/2017     How often do you need to have someone help you when you read instructions, pamphlets, or  other written materials from your doctor or pharmacy?: 1 - Never  Interpreter Needed?: No  Information entered by :: Dalina Samara, RMA   Activities of Daily Living     09/13/2024    8:14 AM  In your present state of health, do you have any difficulty performing the following activities:  Hearing? 0  Vision? 0  Difficulty concentrating or making decisions? 0  Walking or climbing stairs? 0  Dressing or bathing? 0  Doing errands, shopping? 0  Preparing Food and eating ? N  Using the Toilet? N  In the past six months, have you accidently leaked urine? N  Do you have problems with loss of bowel control? N  Managing your Medications? N  Managing your Finances? N  Housekeeping or managing your Housekeeping? N    Patient Care Team: Mahlon Comer BRAVO, MD as PCP - General (Family Medicine) Elmira Newman PARAS, MD as PCP - Cardiology (Cardiology)  I have updated your Care Teams any recent Medical Services you may have received from other providers in the past year.     Assessment:   This is a routine wellness examination for Rayquan.  Hearing/Vision screen Hearing Screening - Comments:: Denies hearing difficulties   Vision Screening - Comments:: Wears eyeglasses/[facility]    Goals Addressed             This Visit's Progress    Patient Stated   On track    Continue current lifestyle       Depression Screen     09/13/2024    8:20 AM 07/14/2024    4:56 PM 09/17/2023   10:13 AM 08/05/2023    8:10 AM 04/20/2023    8:53 AM 10/22/2022    9:55 AM 08/07/2022   12:51 PM  PHQ 2/9 Scores  PHQ - 2 Score 0 6 2 0 0 0 3  PHQ- 9 Score 0 20 6 0 0 0 7    Fall Risk     09/13/2024    8:17 AM 10/08/2023   10:26 AM 09/17/2023   10:13 AM 08/05/2023    8:05 AM 04/20/2023    8:54 AM  Fall Risk   Falls in the past year? 0 0 0 0 0  Number falls in past yr: 0 0 0 0 0  Injury with Fall? 0 0 0 0 0  Risk for fall due to :  No Fall Risks No Fall Risks  No Fall Risks  Follow up Falls evaluation completed;Falls prevention discussed Falls evaluation completed Falls evaluation completed Falls evaluation completed;Education provided;Falls prevention discussed Falls evaluation completed    MEDICARE RISK AT HOME:  Medicare Risk at Home Any stairs in or around the home?: Yes (in front of home) If so, are there any without handrails?: No Home free of loose throw rugs in walkways, pet beds, electrical cords, etc?: Yes Adequate lighting in your home to reduce risk of falls?: Yes Life alert?: No Use of a cane, walker or w/c?: No Grab bars in the bathroom?: Yes Shower chair or bench in shower?: Yes Elevated toilet seat or a handicapped toilet?: Yes  TIMED UP AND GO:  Was the test performed?  No  Cognitive Function: Declined/Normal: No cognitive concerns noted by patient or family. Patient alert, oriented, able to answer questions appropriately and recall recent events. No signs of memory loss or confusion.        08/05/2023    8:07 AM 10/22/2022    9:51 AM  6CIT Screen  What Year? 0 points 0 points  What month? 0 points 0 points  What time? 0 points 0 points  Count back from 20 0 points 0 points  Months in reverse 0 points 4 points  Repeat phrase 0 points 0 points  Total  Score 0 points 4 points    Immunizations Immunization History  Administered Date(s) Administered   Influenza,inj,Quad PF,6+ Mos 07/20/2013, 10/20/2017, 07/30/2021, 08/07/2022    Screening Tests Health Maintenance  Topic Date Due   HIV Screening  Never done   DTaP/Tdap/Td (1 - Tdap) Never done   Hepatitis B Vaccines 19-59 Average Risk (1 of 3 - 19+ 3-dose series) Never done   HPV VACCINES (1 - 3-dose SCDM series) Never done   Influenza Vaccine  06/16/2024   COVID-19 Vaccine (1 - 2025-26 season) Never done   Medicare Annual Wellness (AWV)  09/13/2025   Hepatitis C Screening  Completed   Pneumococcal Vaccine  Aged Out   Meningococcal B Vaccine  Aged Out    Health Maintenance Items Addressed: See Nurse Notes at the end of this note  Additional Screening:  Vision Screening: Recommended annual ophthalmology exams for early detection of glaucoma and other disorders of the eye. Is the patient up to date with their annual eye exam?  Yes  Who is the provider or what is the name of the office in which the patient attends annual eye exams? [facility]/per pt-up to date.  Dental Screening: Recommended annual dental exams for proper oral hygiene  Community Resource Referral / Chronic Care Management: CRR required this visit?  No   CCM required this visit?  No   Plan:    I have personally reviewed and noted the following in the patient's chart:   Medical and social history Use of alcohol, tobacco or illicit drugs  Current medications and supplements including opioid prescriptions. Patient is not currently taking opioid prescriptions. Functional ability and status Nutritional status Physical activity Advanced directives List of other physicians Hospitalizations, surgeries, and ER visits in previous 12 months Vitals Screenings to include cognitive, depression, and falls Referrals and appointments  In addition, I have reviewed and discussed with patient  certain preventive protocols, quality metrics, and best practice recommendations. A written personalized care plan for preventive services as well as general preventive health recommendations were provided to patient.   Alford Gamero L Roisin Mones, CMA   09/13/2024   After Visit Summary: (MyChart) Due to this being a telephonic visit, the after visit summary with patients personalized plan was offered to patient via MyChart   Notes: Patient declines all due vaccine for now.  He had no other concerns to address today.

## 2024-09-13 NOTE — Patient Instructions (Signed)
 Richard Marks,  Thank you for taking the time for your Medicare Wellness Visit. I appreciate your continued commitment to your health goals. Please review the care plan we discussed, and feel free to reach out if I can assist you further.  Medicare recommends these wellness visits once per year to help you and your care team stay ahead of potential health issues. These visits are designed to focus on prevention, allowing your provider to concentrate on managing your acute and chronic conditions during your regular appointments.  Please note that Annual Wellness Visits do not include a physical exam. Some assessments may be limited, especially if the visit was conducted virtually. If needed, we may recommend a separate in-person follow-up with your provider.  Ongoing Care Seeing your primary care provider every 3 to 6 months helps us  monitor your health and provide consistent, personalized care. Last office visit on 05/05/2024.  Keep up the good work.  Referrals If a referral was made during today's visit and you haven't received any updates within two weeks, please contact the referred provider directly to check on the status.  Recommended Screenings:  Health Maintenance  Topic Date Due   HIV Screening  Never done   DTaP/Tdap/Td vaccine (1 - Tdap) Never done   Hepatitis B Vaccine (1 of 3 - 19+ 3-dose series) Never done   HPV Vaccine (1 - 3-dose SCDM series) Never done   Flu Shot  06/16/2024   COVID-19 Vaccine (1 - 2025-26 season) Never done   Medicare Annual Wellness Visit  09/13/2025   Hepatitis C Screening  Completed   Pneumococcal Vaccine  Aged Out   Meningitis B Vaccine  Aged Out       09/13/2024    8:17 AM  Advanced Directives  Does Patient Have a Medical Advance Directive? Yes  Type of Advance Directive Healthcare Power of [facility]   Advance Care Planning is important because it: Ensures you receive medical care that aligns with your values, goals, and preferences. Provides  guidance to your family and loved ones, reducing the emotional burden of decision-making during critical moments.  Vision: Annual vision screenings are recommended for early detection of glaucoma, cataracts, and diabetic retinopathy. These exams can also reveal signs of chronic conditions such as diabetes and high blood pressure.  Dental: Annual dental screenings help detect early signs of oral cancer, gum disease, and other conditions linked to overall health, including heart disease and diabetes.  Please see the attached documents for additional preventive care recommendations.

## 2024-09-15 ENCOUNTER — Other Ambulatory Visit: Payer: Self-pay | Admitting: Family Medicine

## 2024-10-06 ENCOUNTER — Other Ambulatory Visit: Payer: Self-pay | Admitting: Family

## 2024-10-06 DIAGNOSIS — F321 Major depressive disorder, single episode, moderate: Secondary | ICD-10-CM

## 2024-10-27 ENCOUNTER — Other Ambulatory Visit: Payer: Self-pay | Admitting: Family Medicine

## 2024-10-27 DIAGNOSIS — E785 Hyperlipidemia, unspecified: Secondary | ICD-10-CM

## 2024-10-31 ENCOUNTER — Other Ambulatory Visit: Payer: Self-pay | Admitting: Family

## 2024-10-31 DIAGNOSIS — F321 Major depressive disorder, single episode, moderate: Secondary | ICD-10-CM

## 2024-11-03 ENCOUNTER — Other Ambulatory Visit: Payer: Self-pay | Admitting: Family Medicine

## 2024-11-03 DIAGNOSIS — E785 Hyperlipidemia, unspecified: Secondary | ICD-10-CM

## 2024-11-07 ENCOUNTER — Other Ambulatory Visit: Payer: Self-pay | Admitting: Family Medicine

## 2024-11-07 DIAGNOSIS — E785 Hyperlipidemia, unspecified: Secondary | ICD-10-CM

## 2024-11-11 ENCOUNTER — Encounter: Payer: Self-pay | Admitting: *Deleted

## 2024-11-11 ENCOUNTER — Ambulatory Visit: Admission: EM | Admit: 2024-11-11 | Discharge: 2024-11-11 | Disposition: A | Discharge status: Home / Self Care

## 2024-11-11 DIAGNOSIS — S61209A Unspecified open wound of unspecified finger without damage to nail, initial encounter: Secondary | ICD-10-CM | POA: Diagnosis not present

## 2024-11-11 DIAGNOSIS — S61213A Laceration without foreign body of left middle finger without damage to nail, initial encounter: Secondary | ICD-10-CM

## 2024-11-11 MED ORDER — ONDANSETRON HCL 4 MG/2ML IJ SOLN
4.0000 mg | Freq: Once | INTRAMUSCULAR | Status: AC
  Administered 2024-11-11: 4 mg via INTRAMUSCULAR

## 2024-11-11 NOTE — ED Provider Notes (Signed)
 " [facility]    CSN: [id number] Arrival date & time: 11/11/24  1248      History   Chief Complaint Chief Complaint  Patient presents with   Laceration    HPI Richard Marks is a 38 y.o. male.   Pt presents today due to 1 cm laceration of dorsum of left middle finger that happened around 12 pm. Pt states that he used his pocket knife to cut plastic to open a toy and accidentally cut his finger. Pt states that he is unable extend distal phalanx of left middle finger since laceration.   The history is provided by the patient.  Laceration   Past Medical History:  Diagnosis Date   Acid reflux    Hypercholesterolemia    Hypertension    [name]    Diagnosed at [facility] when patient was 38 years old    Patient Active Problem List   Diagnosis Date Noted   Depression, major, single episode, moderate (HCC) 07/14/2024   Generalized anxiety disorder 07/14/2024   Mixed hyperlipidemia 04/28/2024   HTN (hypertension) 10/08/2023   Gastroesophageal reflux disease 10/08/2023   Exertional dyspnea 10/08/2023   Overweight (BMI 25.0-29.9) 08/07/2022   Other intervertebral disc degeneration, lumbar region 07/08/2020   Low back pain 10/11/2019   Physical exam 10/20/2017   Need for immunization against influenza 10/20/2017   [name] 07/20/2013    Past Surgical History:  Procedure Laterality Date   NO PAST SURGERIES         Home Medications    Prior to Admission medications  Medication Sig Start Date End Date Taking? Authorizing Provider  escitalopram  (LEXAPRO ) 5 MG tablet Take 1 tablet by mouth once daily 10/06/24  Yes Hudnell, Corean, NP  omeprazole  (PRILOSEC) 40 MG capsule Take 1 capsule (40 mg total) by mouth daily. 07/25/24  Yes Tabori, Katherine E, MD  rosuvastatin  (CRESTOR ) 20 MG tablet TAKE 1 TABLET BY MOUTH ONCE DAILY, OKAY TO TAKE IN THE MORNING. 11/07/24  Yes Tabori, Katherine E, MD  valsartan  (DIOVAN ) 80 MG tablet Take 1 tablet  by mouth once daily 09/15/24  Yes Tabori, Katherine E, MD  nitroGLYCERIN  (NITROSTAT ) 0.4 MG SL tablet Place 1 tablet (0.4 mg total) under the tongue every 5 (five) minutes as needed for chest pain. Patient not taking: Reported on 09/13/2024 02/25/24 07/14/24  Elmira Newman JINNY, MD    Family History Family History  Problem Relation Age of Onset   Diabetes Mother    Hypertension Mother    Hyperlipidemia Mother    Depression Mother    Hypertension Father    Diabetes Father    COPD Father    Alcohol abuse Sister    Asthma Sister    Depression Sister    Drug abuse Sister    Hypertension Sister    Hypertension Sister    Depression Sister    Diabetes Sister    Depression Brother    Diabetes Maternal Grandmother    Heart disease Maternal Grandmother    Hyperlipidemia Maternal Grandmother    Kidney disease Maternal Grandmother    Heart disease Maternal Grandfather    Hypertension Maternal Grandfather    Kidney disease Maternal Grandfather    Arthritis Paternal Grandmother    Diabetes Paternal Grandmother    Heart disease Paternal Grandmother    Hyperlipidemia Paternal Grandmother    Hypertension Paternal Grandmother    Kidney disease Paternal Grandmother    Stroke Paternal Grandmother    Asthma Paternal Grandfather  COPD Paternal Grandfather    Hyperlipidemia Paternal Grandfather    Hypertension Paternal Grandfather    Kidney disease Paternal Grandfather     Social History Social History[1]   Allergies   Patient has no known allergies.   Review of Systems Review of Systems   Physical Exam Triage Vital Signs ED Triage Vitals  Encounter Vitals Group     BP 11/11/24 1258 128/86     Girls Systolic BP Percentile --      Girls Diastolic BP Percentile --      Boys Systolic BP Percentile --      Boys Diastolic BP Percentile --      Pulse Rate 11/11/24 1258 93     Resp 11/11/24 1258 16     Temp 11/11/24 1258 98.4 F (36.9 C)     Temp Source 11/11/24 1258 Oral      SpO2 11/11/24 1258 96 %     Weight --      Height --      Head Circumference --      Peak Flow --      Pain Score 11/11/24 1256 2     Pain Loc --      Pain Education --      Exclude from Growth Chart --    No data found.  Updated Vital Signs BP 128/86 (BP Location: Right Arm)   Pulse 93   Temp 98.4 F (36.9 C) (Oral)   Resp 16   SpO2 96%   Visual Acuity Right Eye Distance:   Left Eye Distance:   Bilateral Distance:    Right Eye Near:   Left Eye Near:    Bilateral Near:     Physical Exam Vitals and nursing note reviewed.  Constitutional:      General: He is not in acute distress.    Appearance: Normal appearance. He is not ill-appearing, toxic-appearing or diaphoretic.  Eyes:     General: No scleral icterus. Cardiovascular:     Rate and Rhythm: Normal rate and regular rhythm.     Heart sounds: Normal heart sounds.  Pulmonary:     Effort: Pulmonary effort is normal. No respiratory distress.     Breath sounds: Normal breath sounds. No wheezing or rhonchi.  Musculoskeletal:     Left hand: Laceration (dorsum of left middle finger over DIP) present. No swelling or deformity. Decreased range of motion (unable to extend DIP joint). Normal sensation. Normal capillary refill.  Skin:    General: Skin is warm.  Neurological:     Mental Status: He is alert and oriented to person, place, and time.  Psychiatric:        Mood and Affect: Mood normal.        Behavior: Behavior normal.      UC Treatments / Results  Labs (all labs ordered are listed, but only abnormal results are displayed) Labs Reviewed - No data to display  EKG   Radiology No results found.  Procedures Laceration Repair: L long finger  Date/Time: 11/11/2024 2:53 PM  Performed by: Andra Corean BROCKS, PA-C Authorized by: Andra Corean BROCKS, PA-C   Consent:    Consent obtained:  Verbal   Consent given by:  Patient   Risks discussed:  Infection, tendon damage and need for additional  repair   Alternatives discussed:  No treatment Universal protocol:    Patient identity confirmed:  Verbally with patient Anesthesia:    Anesthesia method:  Nerve block   Block needle gauge:  25  G   Block anesthetic:  Lidocaine  1% w/o epi   Block technique:  PIP   Block injection procedure:  Anatomic landmarks identified, introduced needle, incremental injection and anatomic landmarks palpated   Block outcome:  Anesthesia achieved Laceration details:    Location:  Finger   Finger location:  L long finger   Length (cm):  1 Pre-procedure details:    Preparation:  Patient was prepped and draped in usual sterile fashion Exploration:    Limited defect created (wound extended): no     Hemostasis achieved with:  Direct pressure   Wound extent: areolar tissue violated   Treatment:    Amount of cleaning:  Standard Skin repair:    Repair method:  Sutures   Suture size:  5-0   Suture technique:  Simple interrupted   Number of sutures:  3 Approximation:    Approximation:  Close Repair type:    Repair type:  Simple Post-procedure details:    Dressing:  Splint for protection   Procedure completion:  Tolerated well, no immediate complications  (including critical care time)  Medications Ordered in UC Medications  ondansetron  (ZOFRAN ) injection 4 mg (has no administration in time range)    Initial Impression / Assessment and Plan / UC Course  I have reviewed the triage vital signs and the nursing notes.  Pertinent labs & imaging results that were available during my care of the patient were reviewed by me and considered in my medical decision making (see chart for details).     Final Clinical Impressions(s) / UC Diagnoses   Final diagnoses:  Laceration of left middle finger without foreign body without damage to nail, initial encounter  Open wound of finger with tendon injury, initial encounter     Discharge Instructions      Please keep wound clean and dry and keep finger  splinted in extension until you see hand specialist.   Information for 2 different hand specialist have been placed on discharge papers, please call and make an appt   ED Prescriptions   None    PDMP not reviewed this encounter.    [1]  Social History Tobacco Use   Smoking status: Never   Smokeless tobacco: Never  Vaping Use   Vaping status: Never Used  Substance Use Topics   Alcohol use: No   Drug use: No     Andra Corean BROCKS, PA-C 11/11/24 1455  "

## 2024-11-11 NOTE — ED Triage Notes (Signed)
 Pt reports laceration to left middle finger with a pocket knife while opening a package. States he felt light headed and vomited x 1 after the injury. Also reports he can raise his finger but it won't stay lifted. Bandage in place from home.

## 2024-11-11 NOTE — Discharge Instructions (Addendum)
 Please keep wound clean and dry and keep finger splinted in extension until you see hand specialist.   Information for 2 different hand specialist have been placed on discharge papers, please call and make an appt

## 2024-11-14 ENCOUNTER — Telehealth: Payer: Self-pay | Admitting: Family Medicine

## 2024-11-14 NOTE — Telephone Encounter (Signed)
 Copied from CRM #[id number]. Topic: Referral - Question >> Nov 13, 2024  4:43 PM Dedra B wrote: Reason for CRM: Pt's mom, Ginger Rester, said pt cut his left middle finger. He took him to urgent care and they referred him to a hand specialist in the [facility] network. However, she prefers him to see a specialist in the [facility] network. She wants to know if an appt is needed or can a referral be sent.

## 2024-11-14 NOTE — Telephone Encounter (Signed)
 Called patients mom and advised that she call the urgent care to get referral sent to [facility]. She said she would do that and call [facility] and see if she can just make an appointment

## 2024-12-12 ENCOUNTER — Other Ambulatory Visit: Payer: Self-pay | Admitting: Family Medicine

## 2025-09-20 ENCOUNTER — Ambulatory Visit
